# Patient Record
Sex: Female | Born: 1990
Health system: Southern US, Community
[De-identification: ages and names within clinical notes are randomized; demographics above are authoritative.]

## PROBLEM LIST (undated history)

## (undated) DIAGNOSIS — G932 Benign intracranial hypertension: Secondary | ICD-10-CM

## (undated) DIAGNOSIS — E119 Type 2 diabetes mellitus without complications: Secondary | ICD-10-CM

## (undated) DIAGNOSIS — I10 Essential (primary) hypertension: Secondary | ICD-10-CM

## (undated) HISTORY — DX: Essential (primary) hypertension: I10

## (undated) HISTORY — PX: GASTRIC BYPASS: SHX52

## (undated) HISTORY — DX: Morbid (severe) obesity due to excess calories: E66.01

## (undated) HISTORY — DX: Benign intracranial hypertension: G93.2

---
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
## 2012-07-07 ENCOUNTER — Emergency Department (INDEPENDENT_AMBULATORY_CARE_PROVIDER_SITE_OTHER)
Admission: EM | Admit: 2012-07-07 | Discharge: 2012-07-07 | Disposition: A | Discharge status: Home / Self Care | Payer: Medicaid Other | Source: Home / Self Care | Attending: Emergency Medicine | Admitting: Emergency Medicine

## 2012-07-07 ENCOUNTER — Encounter (HOSPITAL_COMMUNITY): Payer: Self-pay

## 2012-07-07 ENCOUNTER — Ambulatory Visit (HOSPITAL_COMMUNITY)
Admission: RE | Admit: 2012-07-07 | Discharge: 2012-07-07 | Disposition: A | Discharge status: Home / Self Care | Payer: Medicaid Other | Source: Ambulatory Visit | Attending: Family Medicine | Admitting: Family Medicine

## 2012-07-07 DIAGNOSIS — R51 Headache: Secondary | ICD-10-CM | POA: Insufficient documentation

## 2012-07-07 DIAGNOSIS — G44209 Tension-type headache, unspecified, not intractable: Secondary | ICD-10-CM

## 2012-07-07 DIAGNOSIS — R42 Dizziness and giddiness: Secondary | ICD-10-CM | POA: Insufficient documentation

## 2012-07-07 DIAGNOSIS — H538 Other visual disturbances: Secondary | ICD-10-CM | POA: Insufficient documentation

## 2012-07-07 DIAGNOSIS — H539 Unspecified visual disturbance: Secondary | ICD-10-CM

## 2012-07-07 DIAGNOSIS — G43909 Migraine, unspecified, not intractable, without status migrainosus: Secondary | ICD-10-CM

## 2012-07-07 DIAGNOSIS — J329 Chronic sinusitis, unspecified: Secondary | ICD-10-CM

## 2012-07-07 LAB — CBC WITH DIFFERENTIAL/PLATELET
Basophils Absolute: 0 10*3/uL (ref 0.0–0.1)
Eosinophils Relative: 2 % (ref 0–5)
HCT: 37.2 % (ref 36.0–46.0)
Hemoglobin: 13.2 g/dL (ref 12.0–15.0)
Lymphocytes Relative: 30 % (ref 12–46)
Lymphs Abs: 3.9 10*3/uL (ref 0.7–4.0)
MCH: 29.5 pg (ref 26.0–34.0)
MCV: 83.2 fL (ref 78.0–100.0)
Monocytes Absolute: 0.6 10*3/uL (ref 0.1–1.0)
Neutro Abs: 8.4 10*3/uL — ABNORMAL HIGH (ref 1.7–7.7)
RBC: 4.47 MIL/uL (ref 3.87–5.11)
RDW: 13 % (ref 11.5–15.5)
WBC: 13.2 10*3/uL — ABNORMAL HIGH (ref 4.0–10.5)

## 2012-07-07 LAB — COMPREHENSIVE METABOLIC PANEL
ALT: 19 U/L (ref 0–35)
AST: 16 U/L (ref 0–37)
Albumin: 4.4 g/dL (ref 3.5–5.2)
CO2: 25 mEq/L (ref 19–32)
Calcium: 10.1 mg/dL (ref 8.4–10.5)
Chloride: 99 mEq/L (ref 96–112)
Creatinine, Ser: 0.79 mg/dL (ref 0.50–1.10)
GFR calc Af Amer: >90 mL/min (ref >90)
GFR calc non Af Amer: >90 mL/min (ref >90)
Glucose, Bld: 88 mg/dL (ref 70–99)
Total Bilirubin: 0.2 mg/dL — ABNORMAL LOW (ref 0.3–1.2)

## 2012-07-07 LAB — SEDIMENTATION RATE: Sed Rate: 21 mm/hr (ref 0–22)

## 2012-07-07 LAB — POCT URINALYSIS DIP (DEVICE)
Glucose, UA: NEGATIVE mg/dL
Leukocytes, UA: NEGATIVE
Protein, ur: 100 mg/dL — AB
Specific Gravity, Urine: 1.02 (ref 1.005–1.030)
Urobilinogen, UA: 0.2 mg/dL (ref 0.0–1.0)

## 2012-07-07 LAB — LIPID PANEL
HDL: 45 mg/dL (ref >39)
LDL Cholesterol: 94 mg/dL (ref 0–99)
Triglycerides: 99 mg/dL (ref <150)

## 2012-07-07 LAB — HEMOGLOBIN A1C
Hgb A1c MFr Bld: 5.6 % (ref <5.7)
Mean Plasma Glucose: 114 mg/dL (ref <117)

## 2012-07-07 LAB — GLUCOSE, CAPILLARY: Glucose-Capillary: 81 mg/dL (ref 70–99)

## 2012-07-07 MED ORDER — NAPROXEN 500 MG PO TABS: 500.0000 mg | ORAL_TABLET | Freq: Two times a day (BID) | ORAL | Status: DC

## 2012-07-07 MED ORDER — FLUTICASONE PROPIONATE 50 MCG/ACT NA SUSP: 2.0000 | Freq: Every day | NASAL | Status: DC

## 2012-07-07 MED ORDER — ACIDOPHILUS PROBIOTIC 100 MG PO CAPS: 1.0000 | ORAL_CAPSULE | Freq: Three times a day (TID) | ORAL | Status: DC

## 2012-07-07 MED ORDER — LORATADINE 10 MG PO TABS: 10.0000 mg | ORAL_TABLET | Freq: Every day | ORAL | Status: DC

## 2012-07-07 MED ORDER — AMOXICILLIN 500 MG PO CAPS: 500.0000 mg | ORAL_CAPSULE | Freq: Three times a day (TID) | ORAL | Status: DC

## 2012-07-07 NOTE — ED Notes (Signed)
Patients states has had a headache for almost 2 weeks, some nausea pain , pain in back of neck, blurred vision

## 2012-07-07 NOTE — ED Provider Notes (Addendum)
History    CSN: [id number]  Arrival date & time 07/07/12  1519   First MD Initiated Contact with Patient 07/07/12 1527     Chief Complaint  Patient presents with  . Headache   Patient is a 22 y.o. female presenting with headaches. The history is provided by the patient. The history is limited by a language barrier. A language interpreter was used.  Headache The primary symptoms include headaches. The symptoms began more than 1 week ago. The symptoms are improving.  The headache is not associated with neck stiffness.  Additional symptoms do not include neck stiffness.   Pt reporting that she his having a headache for the past almost 2 weeks.  Pt says that it started with a right sided mild headache and then it got stronger over the next days.  It has been associated with nausea and some vomiting.  Pt says that it has been a 10/10 headache at its worse.   Pt says it is an 8/10 now.  Pt says that she started having blurry vision yesterday.   Pt says that she is having blurry vision.  No frequent urination reported.  Pt says that she does not have a history of frequent headaches.   Pt says that she was taking midol and took some advil cold and sinus.  Pt says that it helped some.  Pt says that she is not having pressure around the sinus or runny nose and no postnasal drainage.   Pt says she is not having any abdominal pain.  No fever.  Pt says that she has had some fever.  No weakness in extremities.   Pt says that she did have her period last month.  Pt says not sexually active.   No coughing or sneezing.  Pt says that she is having dry throat sometimes.    History reviewed. No pertinent past medical history.  History reviewed. No pertinent past surgical history.  No family history on file.  History  Substance Use Topics  . Smoking status: Never Smoker   . Smokeless tobacco: Not on file  . Alcohol Use: No    OB History    Grav Para Term Preterm Abortions TAB SAB Ect Mult Living          Review of Systems  Constitutional: Positive for appetite change and fatigue.  HENT: Positive for neck pain. Negative for neck stiffness.   Eyes: Positive for visual disturbance.  Neurological: Positive for headaches.  Hematological: Negative.   Psychiatric/Behavioral: Negative.   All other systems reviewed and are negative.    Allergies  Review of patient's allergies indicates no known allergies.  Home Medications  No current outpatient prescriptions on file.  BP 132/75  Pulse 87  Temp 98.5 F (36.9 C) (Oral)  Resp 18  SpO2 100%  LMP 05/24/2012  Physical Exam  Nursing note and vitals reviewed. Constitutional: She is oriented to person, place, and time. She appears well-developed and well-nourished.       Obese female   HENT:  Head: Normocephalic and atraumatic.  Nose: Mucosal edema present. Right sinus exhibits maxillary sinus tenderness. Left sinus exhibits maxillary sinus tenderness.  Mouth/Throat: No oropharyngeal exudate.       Yellow crusting seen in nasal cavities  Eyes: Conjunctivae normal and EOM are normal. Pupils are equal, round, and reactive to light. Right eye exhibits no discharge. Left eye exhibits no discharge. No scleral icterus.       Could not perform adequate fundoscopic exam  Neck: Normal range of motion. Neck supple.    Cardiovascular: Normal rate, regular rhythm and normal heart sounds.   Pulmonary/Chest: Effort normal and breath sounds normal.  Abdominal: Soft. Bowel sounds are normal.  Musculoskeletal: Normal range of motion. She exhibits tenderness.       Tenderness in trapezius muscle right side of neck and shoulder  FROM of neck no deformities noted   Neurological: She is alert and oriented to person, place, and time. She has normal strength. No cranial nerve deficit or sensory deficit. She exhibits normal muscle tone. She displays a negative Romberg sign. Coordination and gait normal.  Skin: Skin is warm and dry.  Psychiatric:  She has a normal mood and affect. Her behavior is normal. Judgment and thought content normal.    ED Course  Procedures (including critical care time)  Labs Reviewed - No data to display No results found.   No diagnosis found.   MDM  IMPRESSION  Headache  ? Migraine headache  Sinusitis  Allergic Rhinitis  Obesity  Blurry Visual changes  RECOMMENDATIONS / PLAN Sending patient for STAT CT of the head without contrast.  Sinusitis will be treated with amoxicillin 500 mg by mouth 3 times a day, Flonase nasal spray and loratadine ordered.  In addition, I will order some labs today including a CBC diff, metabolic panel, blood sugar, urinalysis and we'll followup on those results.  The patient was given instructions to go to the ER if symptoms do not improve or worsen.  She verbalized understanding. Also considered pseudotumor cerebri (IIH), after CT scan, if no mass lesions seen then likely will send for LP and refer to opthalmology and neurology for further eval.  Unable to get adequate fundoscopic exam in office not being able to dilate eyes fully, did not appreciate any papilledema on limited exam.   FOLLOW UP 1 week for recheck  The patient was given clear instructions to go to ER or return to medical center if symptoms don't improve, worsen or new problems develop.  The patient verbalized understanding.  The patient was told to call to get lab results if they haven't heard anything in the next week.            Cleora Fleet, MD 07/07/12 1956  Cleora Fleet, MD 07/08/12 [phone]

## 2012-07-08 ENCOUNTER — Encounter (HOSPITAL_COMMUNITY): Payer: Self-pay

## 2012-07-08 ENCOUNTER — Emergency Department (HOSPITAL_COMMUNITY)
Admission: EM | Admit: 2012-07-08 | Discharge: 2012-07-08 | Disposition: A | Discharge status: Home / Self Care | Payer: Medicaid Other | Attending: Emergency Medicine | Admitting: Emergency Medicine

## 2012-07-08 ENCOUNTER — Telehealth (HOSPITAL_COMMUNITY): Payer: Self-pay

## 2012-07-08 DIAGNOSIS — G932 Benign intracranial hypertension: Secondary | ICD-10-CM | POA: Insufficient documentation

## 2012-07-08 LAB — CBC WITH DIFFERENTIAL/PLATELET
Basophils Absolute: 0 10*3/uL (ref 0.0–0.1)
Basophils Relative: 0 % (ref 0–1)
Lymphocytes Relative: 36 % (ref 12–46)
MCHC: 36.5 g/dL — ABNORMAL HIGH (ref 30.0–36.0)
Neutro Abs: 6.4 10*3/uL (ref 1.7–7.7)
Neutrophils Relative %: 55 % (ref 43–77)
Platelets: 326 10*3/uL (ref 150–400)
RDW: 12.7 % (ref 11.5–15.5)
WBC: 11.7 10*3/uL — ABNORMAL HIGH (ref 4.0–10.5)

## 2012-07-08 LAB — BASIC METABOLIC PANEL
CO2: 26 mEq/L (ref 19–32)
Chloride: 102 mEq/L (ref 96–112)
Creatinine, Ser: 0.78 mg/dL (ref 0.50–1.10)
GFR calc Af Amer: >90 mL/min (ref >90)
Potassium: 3.9 mEq/L (ref 3.5–5.1)
Sodium: 139 mEq/L (ref 135–145)

## 2012-07-08 MED ORDER — ACETAZOLAMIDE 125 MG PO TABS: 250.0000 mg | ORAL_TABLET | Freq: Three times a day (TID) | ORAL | Status: DC

## 2012-07-08 MED ORDER — SODIUM CHLORIDE 0.9 % IV BOLUS (SEPSIS): 1000.0000 mL | Freq: Once | INTRAVENOUS | Status: DC

## 2012-07-08 NOTE — Consult Note (Signed)
NEURO HOSPITALIST CONSULT NOTE    Reason for Consult: Headache and blurred vision with papilledema.  HPI:                                                                                                                                          Gabriela Cantu is an 22 y.o. female is seen in the emergency room last 2 days with complaint of headache and blurred vision. She was seen at urgent care Center noted papilledema and referred her to ophthalmology who confirmed that she does have bilateral papilledema. CT scan of her head was done yesterday which was unremarkable. Patient was referred from ophthalmology to the emergency room here for lumbar puncture and neurology evaluation. She is not complaining of nausea and has not had diplopia. Patient is markedly obese and is strongly suspected to have having pseudotumor cerebra.  History reviewed. No pertinent past medical history.  History reviewed. No pertinent past surgical history.  Family history is noncontributory.  Social History:  reports that she has never smoked. She does not have any smokeless tobacco history on file. She reports that she does not drink alcohol. Her drug history not on file.  No Known Allergies  MEDICATIONS:                                                                                                                     Prior to Admission:  Amoxicillin 500 mg 3 times a day Flonase nasal spray 2 sprays in each nostril daily Lactobacillus 1 capsule 3 times a day Claritin 10 mg per day Naprosyn 500 mg twice a day.    Blood pressure 146/93, pulse 86, temperature 98.1 F (36.7 C), temperature source Oral, resp. rate 18, last menstrual period 05/24/2012, SpO2 100.00%.   Neurologic Examination:  Mental Status: Alert, oriented, thought content appropriate.  Speech fluent without evidence of aphasia.  Able to follow commands without difficulty. Cranial Nerves: II-Visual fields were normal. Patient was able to read 12 point font print accurately with each eye independently. III/IV/VI-Pupils were dilated and equal and react minimally to light. Extraocular movements were full and conjugate.    V/VII-no facial numbness and no facial weakness. VIII-normal. X-normal speech and symmetrical palatal movement. Motor: 5/5 bilaterally with normal tone and bulk Sensory: Normal throughout. Deep Tendon Reflexes: 2+ and symmetric. Cerebellar: Normal finger-to-nose testing.  Lab Results  Component Value Date/Time   CHOL 159 07/07/2012  6:00 PM    Results for orders placed during the hospital encounter of 07/08/12 (from the past 48 hour(s))  CBC WITH DIFFERENTIAL     Status: Abnormal   Collection Time   07/08/12  6:53 PM      Component Value Range Comment   WBC 11.7 (*) 4.0 - 10.5 K/uL    RBC 4.37  3.87 - 5.11 MIL/uL    Hemoglobin 13.2  12.0 - 15.0 g/dL    HCT 16.1  09.6 - 04.5 %    MCV 82.8  78.0 - 100.0 fL    MCH 30.2  26.0 - 34.0 pg    MCHC 36.5 (*) 30.0 - 36.0 g/dL    RDW 40.9  81.1 - 91.4 %    Platelets 326  150 - 400 K/uL    Neutrophils Relative 55  43 - 77 %    Neutro Abs 6.4  1.7 - 7.7 K/uL    Lymphocytes Relative 36  12 - 46 %    Lymphs Abs 4.2 (*) 0.7 - 4.0 K/uL    Monocytes Relative 6  3 - 12 %    Monocytes Absolute 0.7  0.1 - 1.0 K/uL    Eosinophils Relative 3  0 - 5 %    Eosinophils Absolute 0.3  0.0 - 0.7 K/uL    Basophils Relative 0  0 - 1 %    Basophils Absolute 0.0  0.0 - 0.1 K/uL   BASIC METABOLIC PANEL     Status: Normal   Collection Time   07/08/12  6:53 PM      Component Value Range Comment   Sodium 139  135 - 145 mEq/L    Potassium 3.9  3.5 - 5.1 mEq/L    Chloride 102  96 - 112 mEq/L    CO2 26  19 - 32 mEq/L    Glucose, Bld 90  70 - 99 mg/dL    BUN 12  6 - 23 mg/dL    Creatinine, Ser 7.82  0.50 - 1.10 mg/dL    Calcium 9.7  8.4 - 95.6 mg/dL    GFR calc non Af  Amer >90  >90 mL/min    GFR calc Af Amer >90  >90 mL/min     Ct Head Wo Contrast  07/07/2012  *RADIOLOGY REPORT*  Clinical Data: 22 year old female with severe headache, blurred vision and dizziness.  CT HEAD WITHOUT CONTRAST  Technique:  Contiguous axial images were obtained from the base of the skull through the vertex without contrast.  Comparison: None  Findings: No intracranial abnormalities are identified, including mass lesion or mass effect, hydrocephalus, extra-axial fluid collection, midline shift, hemorrhage, or acute infarction.  The visualized bony calvarium is unremarkable.  IMPRESSION: Unremarkable noncontrast head CT.   Original Report Authenticated By: Harmon Pier, M.D.    Assessment/Plan: Likely new onset pseudotumor cerebri as etiology for  headaches and papilledema as well as blurring of vision. CT scan of her head showed no indications of acute intracranial abnormality.  Recommendations: 1. Diamox 250 mg by mouth every 8 hours 2. Lumbar puncture under fluoroscopy as planned. 3. Keep scheduled appointment with [facility] for followup 4. Return to emergency room for evaluation if headache acutely worsens or visual abnormalities become worse.  [facility] [phone]  07/08/2012, 8:55 PM

## 2012-07-08 NOTE — Progress Notes (Signed)
I spoke with the ophthalmologist and saw the patient in the office today for a complete dilated eye examination.  She told me that the patient has severe papilledema in both eyes.  She was very concerned about pseudotumor cerebri.  We are sending the patient to the emergency department so that she can be evaluated.  She's going to need a lumbar puncture and she's going to need to be evaluated by a neurologist and started on medications.   I'm going to call the emergency department and let him know that we are sending this patient over from the ophthalmology office for treatment.   Rodney Langton, MD, CDE, FAAFP [facility] [place], [place]

## 2012-07-08 NOTE — ED Notes (Signed)
Neurology at bedside.

## 2012-07-08 NOTE — ED Notes (Signed)
Pt. Has had a headache for 2 weeks .  She went to urgent care yesterday and they sent her to eye doctor Gabriela Cantu,  She diagnosed her with optic nerve edema.   Also has bilateral disc edema .  Dr. Laural Benes at the Urgent care and Dr. Charlotte Sanes, [phone]. Recommend the pt. Come to the ED for neurology eval and also to r/o menigitis,  Pt. Reports having blurred vision

## 2012-07-08 NOTE — Telephone Encounter (Signed)
Message copied by Lestine Mount on Wed Jul 08, 2012 10:08 AM ------      Message from: Cleora Fleet      Created: Tue Jul 07, 2012  7:57 PM      Regarding: Please call results        Please call and notify the patient that her CT scan came back negative for any acute abnormalities.  Her blood work also came back but he revealed that she had an elevated white blood cell count which is consistent with a possible infection.  I would like for her to return to the clinic in 2 days to have a repeat CBC drawn with the nurse.  I would like for her to continue to take her medications and if her symptoms got any worse to go to the emergency department for further care.                  Rodney Langton, MD, CDE, FAAFP      [facility]      [facility]      [place], [place]

## 2012-07-08 NOTE — Progress Notes (Signed)
I spoke with the office for the ophthalmologist on call Dr. Randon Goldsmith and asked if they would see the patient today to perform complete dilated eye exam to evaluate for papilledema.   I also spoke with a neurologist Dr. Janeece Riggers with [facility] [facility] and ran the case by him.  He said that we should pursue lumbar puncture if the patient has evidence of papilledema.  He said it was low likelihood that the patient would have pseudotumor cerebri if there was no papilledema.  Pt can be seen by opthalmologist today at 1:30 pm for eye exam.   Will fax notes and records to Dr. Randon Goldsmith office.     Rodney Langton, MD, CDE, FAAFP [facility] [place], [place]

## 2012-07-08 NOTE — Progress Notes (Signed)
Quick Note:  Patient was notified of the results by telephone. She's currently being evaluated in the hospital for papilledema and possible pseudotumor cerebri.   Rodney Langton, MD, CDE, FAAFP [facility] [place], [place]   ______

## 2012-07-08 NOTE — Telephone Encounter (Signed)
Message copied by Lestine Mount on Wed Jul 08, 2012 10:17 AM ------      Message from: Cleora Fleet      Created: Tue Jul 07, 2012  7:57 PM      Regarding: Please call results        Please call and notify the patient that her CT scan came back negative for any acute abnormalities.  Her blood work also came back but he revealed that she had an elevated white blood cell count which is consistent with a possible infection.  I would like for her to return to the clinic in 2 days to have a repeat CBC drawn with the nurse.  I would like for her to continue to take her medications and if her symptoms got any worse to go to the emergency department for further care.                  Rodney Langton, MD, CDE, FAAFP      [facility]      [facility]      [place], [place]

## 2012-07-09 NOTE — ED Provider Notes (Signed)
History     CSN: [id number]  Arrival date & time 07/08/12  1727   First MD Initiated Contact with Patient 07/08/12 1819      Chief Complaint  Patient presents with  . Headache    (Consider location/radiation/quality/duration/timing/severity/associated sxs/prior treatment) HPI Comments: Pt comes in with cc of headaches. Pt was see nat an urgent care y'day for headaches with blurry vision. She was sent to [facility] clinic today, noted to have bilateral disk edema and nerve edema, so sent to the ED for further evaluation and possible Neuro evaluation. Pt reports having the headache for few days now. No hx of headaches. She has associated blurry vision. She denies any  nausea, vomiting, seizures, altered mental status, loss of consciousness, new weakness, or numbness, no gait instability. Pt has no fevers, chills. No family hx of brain aneurysms, Multiple sclerosis. Her headache is actually better today. It is diffusely located, and is not worse with laying supine.  Patient is a 22 y.o. female presenting with headaches. The history is provided by the patient and medical records.  Headache  Pertinent negatives include no shortness of breath, no nausea and no vomiting.    History reviewed. No pertinent past medical history.  History reviewed. No pertinent past surgical history.  No family history on file.  History  Substance Use Topics  . Smoking status: Never Smoker   . Smokeless tobacco: Not on file  . Alcohol Use: No    OB History    Grav Para Term Preterm Abortions TAB SAB Ect Mult Living                  Review of Systems  Constitutional: Negative for activity change.  HENT: Negative for facial swelling and neck pain.   Respiratory: Negative for cough, shortness of breath and wheezing.   Cardiovascular: Negative for chest pain.  Gastrointestinal: Negative for nausea, vomiting, abdominal pain, diarrhea, constipation, blood in stool and abdominal distention.    Genitourinary: Negative for hematuria and difficulty urinating.  Skin: Negative for color change.  Neurological: Positive for headaches. Negative for dizziness, syncope, speech difficulty and weakness.  Hematological: Does not bruise/bleed easily.  Psychiatric/Behavioral: Negative for confusion.    Allergies  Review of patient's allergies indicates no known allergies.  Home Medications   Current Outpatient Rx  Name  Route  Sig  Dispense  Refill  . AMOXICILLIN 500 MG PO CAPS   Oral   Take 1 capsule (500 mg total) by mouth 3 (three) times daily.   30 capsule   0   . FLUTICASONE PROPIONATE 50 MCG/ACT NA SUSP   Nasal   Place 2 sprays into the nose daily.   16 g   2   . ACIDOPHILUS PROBIOTIC 100 MG PO CAPS   Oral   Take 1 capsule (100 mg total) by mouth 3 (three) times daily with meals.   90 capsule   0   . LORATADINE 10 MG PO TABS   Oral   Take 1 tablet (10 mg total) by mouth daily.   30 tablet   0   . NAPROXEN 500 MG PO TABS   Oral   Take 1 tablet (500 mg total) by mouth 2 (two) times daily with a meal.   20 tablet   0   . ACETAZOLAMIDE 125 MG PO TABS   Oral   Take 2 tablets (250 mg total) by mouth 3 (three) times daily.   21 tablet   0  BP 146/93  Pulse 86  Temp 98.1 F (36.7 C) (Oral)  Resp 18  SpO2 100%  LMP 05/24/2012  Physical Exam  Nursing note and vitals reviewed. Constitutional: She is oriented to person, place, and time. She appears well-developed.  HENT:  Head: Normocephalic and atraumatic.  Eyes: Conjunctivae normal and EOM are normal.       Dilated pupils - post optho evaluation  Neck: Normal range of motion. Neck supple.  Cardiovascular: Normal rate, regular rhythm and normal heart sounds.   Pulmonary/Chest: Effort normal and breath sounds normal. No respiratory distress.  Abdominal: Soft. Bowel sounds are normal. She exhibits no distension. There is no tenderness. There is no rebound and no guarding.  Neurological: She is alert  and oriented to person, place, and time. No cranial nerve deficit. Coordination normal.  Skin: Skin is warm and dry.    ED Course  Procedures (including critical care time)  Labs Reviewed  CBC WITH DIFFERENTIAL - Abnormal; Notable for the following:    WBC 11.7 (*)     MCHC 36.5 (*)     Lymphs Abs 4.2 (*)     All other components within normal limits  BASIC METABOLIC PANEL  LAB REPORT - SCANNED   No results found.   1. Pseudotumor cerebri       MDM  Pt comes in with cc of headaches. My concerns are that patient has pseudotumor cerebri. She is young, obese, new headache, severe, with optho exam revealing signs of increased ICP  I spoke with Dr. Roseanne Reno, Neurology, who wants a LP for diagnostic and possibly therapeutic reasons. I called IR, and LP will be done as an outpatient tomorrow. The reason being, i checked patient's landmarks, and given her obesity, she doesnst have the best land marks, especially in lateral decubitus position.  Neurology did see the patient - and Dr. Roseanne Reno wants [place] to start her on specific tx - which will be done.  Pt explained that she will get a call from IR tomorrow -if she hasnt received a call by noon, to call the ED for facilitation, or just come to the ER.  Derwood Kaplan, MD 07/09/12 2207

## 2012-07-10 ENCOUNTER — Emergency Department (HOSPITAL_COMMUNITY)
Admission: EM | Admit: 2012-07-10 | Discharge: 2012-07-10 | Disposition: A | Discharge status: Home / Self Care | Payer: Self-pay | Source: Home / Self Care

## 2012-07-10 ENCOUNTER — Emergency Department (HOSPITAL_COMMUNITY)
Admission: EM | Admit: 2012-07-10 | Discharge: 2012-07-10 | Disposition: A | Discharge status: Home / Self Care | Payer: Medicaid Other | Attending: Emergency Medicine | Admitting: Emergency Medicine

## 2012-07-10 ENCOUNTER — Encounter (HOSPITAL_COMMUNITY): Payer: Self-pay | Admitting: *Deleted

## 2012-07-10 DIAGNOSIS — G932 Benign intracranial hypertension: Secondary | ICD-10-CM | POA: Insufficient documentation

## 2012-07-10 DIAGNOSIS — IMO0002 Reserved for concepts with insufficient information to code with codable children: Secondary | ICD-10-CM | POA: Insufficient documentation

## 2012-07-10 MED ORDER — HYDROCODONE-ACETAMINOPHEN 5-325 MG PO TABS: 1.0000 | ORAL_TABLET | Freq: Four times a day (QID) | ORAL | Status: DC | PRN

## 2012-07-10 MED ORDER — LORAZEPAM 0.5 MG PO TABS: 0.5000 mg | ORAL_TABLET | Freq: Three times a day (TID) | ORAL | Status: DC | PRN

## 2012-07-10 NOTE — ED Provider Notes (Signed)
History     CSN: [id number]  Arrival date & time 07/10/12  1646   First MD Initiated Contact with Patient 07/10/12 1704      Chief Complaint  Patient presents with  . Headache    (Consider location/radiation/quality/duration/timing/severity/associated sxs/prior treatment) HPI Patient presents to the emergency department with continued headache.  Patient states she did not go to the appointment for her therapeutic fluoroscopy guided LP.  Patient states that her vision has gotten a little, but worse in the pressure in her head is a little worse, but no other significant findings.  Patient denies vomiting, diarrhea, chest dentures breath, neck pain, or fever. History reviewed. No pertinent past medical history.  History reviewed. No pertinent past surgical history.  No family history on file.  History  Substance Use Topics  . Smoking status: Never Smoker   . Smokeless tobacco: Not on file  . Alcohol Use: No    OB History    Grav Para Term Preterm Abortions TAB SAB Ect Mult Living                  Review of Systems All other systems negative except as documented in the HPI. All pertinent positives and negatives as reviewed in the HPI. Allergies  Review of patient's allergies indicates no known allergies.  Home Medications   Current Outpatient Rx  Name  Route  Sig  Dispense  Refill  . ACETAZOLAMIDE 125 MG PO TABS   Oral   Take 2 tablets (250 mg total) by mouth 3 (three) times daily.   21 tablet   0   . FLUTICASONE PROPIONATE 50 MCG/ACT NA SUSP   Nasal   Place 2 sprays into the nose daily.   16 g   2   . LORATADINE 10 MG PO TABS   Oral   Take 1 tablet (10 mg total) by mouth daily.   30 tablet   0   . NAPROXEN 500 MG PO TABS   Oral   Take 1 tablet (500 mg total) by mouth 2 (two) times daily with a meal.   20 tablet   0     BP 132/82  Pulse 81  Temp 97.5 F (36.4 C) (Oral)  Resp 20  SpO2 98%  LMP 05/24/2012  Physical Exam  Nursing note and  vitals reviewed. Constitutional: She is oriented to person, place, and time. She appears well-developed and well-nourished.  HENT:  Head: Normocephalic and atraumatic.  Eyes: EOM are normal. Pupils are equal, round, and reactive to light.  Neck: Normal range of motion. Neck supple.  Cardiovascular: Normal rate, regular rhythm and normal heart sounds.  Exam reveals no gallop and no friction rub.   No murmur heard. Pulmonary/Chest: Effort normal and breath sounds normal. No respiratory distress.  Neurological: She is alert and oriented to person, place, and time. She exhibits normal muscle tone. Coordination normal.  Skin: Skin is warm and dry. No rash noted. She is not diaphoretic. No erythema.    ED Course  Procedures (including critical care time)  I spoke with the interventional radiologist and felt like that he would be better to do the LP in the morning.  Patient is given explicit instructions on how to come back for this procedure.  Patient is advised that if she cannot figure it out she can come back to the emergency   MDM          Carlyle Dolly, PA-C 07/10/12 1843

## 2012-07-10 NOTE — ED Provider Notes (Signed)
Medical screening examination/treatment/procedure(s) were performed by non-physician practitioner and as supervising physician I was immediately available for consultation/collaboration.   Lyanne Co, MD 07/10/12 [phone]

## 2012-07-10 NOTE — ED Notes (Signed)
Headaches with blurred vision for 2 weeks  With n v no diarrhea.  No past history

## 2012-07-11 ENCOUNTER — Emergency Department (HOSPITAL_COMMUNITY): Payer: Medicaid Other

## 2012-07-11 ENCOUNTER — Emergency Department (HOSPITAL_COMMUNITY)
Admission: EM | Admit: 2012-07-11 | Discharge: 2012-07-11 | Disposition: A | Discharge status: Home / Self Care | Payer: Medicaid Other | Attending: Emergency Medicine | Admitting: Emergency Medicine

## 2012-07-11 ENCOUNTER — Encounter (HOSPITAL_COMMUNITY): Payer: Self-pay | Admitting: Emergency Medicine

## 2012-07-11 DIAGNOSIS — G932 Benign intracranial hypertension: Secondary | ICD-10-CM | POA: Insufficient documentation

## 2012-07-11 DIAGNOSIS — Z79899 Other long term (current) drug therapy: Secondary | ICD-10-CM | POA: Insufficient documentation

## 2012-07-11 DIAGNOSIS — R51 Headache: Secondary | ICD-10-CM

## 2012-07-11 DIAGNOSIS — Z3202 Encounter for pregnancy test, result negative: Secondary | ICD-10-CM | POA: Insufficient documentation

## 2012-07-11 DIAGNOSIS — H547 Unspecified visual loss: Secondary | ICD-10-CM | POA: Insufficient documentation

## 2012-07-11 LAB — CSF CELL COUNT WITH DIFFERENTIAL
RBC Count, CSF: 4 /mm3 — ABNORMAL HIGH
Tube #: 3
WBC, CSF: 3 /mm3 (ref 0–5)

## 2012-07-11 LAB — PROTEIN, CSF: Total  Protein, CSF: 26 mg/dL (ref 15–45)

## 2012-07-11 LAB — GRAM STAIN

## 2012-07-11 LAB — POCT PREGNANCY, URINE: Preg Test, Ur: NEGATIVE

## 2012-07-11 LAB — GLUCOSE, CSF: Glucose, CSF: 53 mg/dL (ref 43–76)

## 2012-07-11 MED ORDER — DIAZEPAM 5 MG PO TABS
5.0000 mg | ORAL_TABLET | Freq: Once | ORAL | Status: AC
  Administered 2012-07-11: 5 mg via ORAL
  Filled 2012-07-11: qty 1

## 2012-07-11 NOTE — ED Notes (Signed)
I gave the patient a happy meal and a cup of ice water. 

## 2012-07-11 NOTE — ED Notes (Signed)
Patient transported to CT 

## 2012-07-11 NOTE — ED Provider Notes (Signed)
History    22 year old female with headache. Patient has been evaluated multiple times recently by family practice and in emergency room. Neurology was consulted as well. Presumed pseudotumor cerebri. Patient has the right demographic. She's complaining of headaches. Visual loss. Bilateral papilledema noted by ophthalmology. She started on acetazolamide which she reports compliance with. She was referred to interventional radiology for lumbar puncture to confirm diagnosis. It appears there has been some miscommunication and patient was referred back to the emergency room.  CSN: [id number]  Arrival date & time 07/11/12  1120   First MD Initiated Contact with Patient 07/11/12 1132      Chief Complaint  Patient presents with  . Headache    (Consider location/radiation/quality/duration/timing/severity/associated sxs/prior treatment) HPI  History reviewed. No pertinent past medical history.  History reviewed. No pertinent past surgical history.  No family history on file.  History  Substance Use Topics  . Smoking status: Never Smoker   . Smokeless tobacco: Not on file  . Alcohol Use: No    OB History    Grav Para Term Preterm Abortions TAB SAB Ect Mult Living                  Review of Systems  All systems reviewed and negative, other than as noted in HPI.   Allergies  Review of patient's allergies indicates no known allergies.  Home Medications   Current Outpatient Rx  Name  Route  Sig  Dispense  Refill  . ACETAZOLAMIDE 125 MG PO TABS   Oral   Take 2 tablets (250 mg total) by mouth 3 (three) times daily.   21 tablet   0   . HYDROCODONE-ACETAMINOPHEN 5-325 MG PO TABS   Oral   Take 1 tablet by mouth every 6 (six) hours as needed. For pain.         Marland Kitchen LORATADINE 10 MG PO TABS   Oral   Take 1 tablet (10 mg total) by mouth daily.   30 tablet   0   . LORAZEPAM 0.5 MG PO TABS   Oral   Take 0.5 mg by mouth 3 (three) times daily as needed. For anxiety.         Marland Kitchen NAPROXEN 500 MG PO TABS   Oral   Take 1 tablet (500 mg total) by mouth 2 (two) times daily with a meal.   20 tablet   0   . FLUTICASONE PROPIONATE 50 MCG/ACT NA SUSP   Nasal   Place 2 sprays into the nose daily.   16 g   2     BP 120/73  Pulse 88  Temp 99.5 F (37.5 C) (Oral)  Resp 18  SpO2 98%  LMP 05/24/2012  Physical Exam  Nursing note and vitals reviewed. Constitutional: She is oriented to person, place, and time.       Laying in bed. No acute distress. Obese.  HENT:  Head: Normocephalic and atraumatic.  Eyes: Conjunctivae normal are normal. Right eye exhibits no discharge. Left eye exhibits no discharge.  Neck: Neck supple.       No nuchal rigidity  Cardiovascular: Normal rate, regular rhythm and normal heart sounds.  Exam reveals no gallop and no friction rub.   No murmur heard. Pulmonary/Chest: Effort normal and breath sounds normal. No respiratory distress.  Abdominal: Soft. She exhibits no distension. There is no tenderness.  Musculoskeletal: She exhibits no edema and no tenderness.  Neurological: She is alert and oriented to person, place, and time. No cranial  nerve deficit. She exhibits normal muscle tone. Coordination normal.  Skin: Skin is warm and dry. She is not diaphoretic.  Psychiatric: She has a normal mood and affect. Her behavior is normal. Thought content normal.    ED Course  LUMBAR PUNCTURE Date/Time: 07/11/2012 1:00 PM Performed by: Raeford Razor Authorized by: Raeford Razor Consent: Verbal consent obtained. Written consent obtained. Risks and benefits: risks, benefits and alternatives were discussed Consent given by: patient Required items: required blood products, implants, devices, and special equipment available Patient identity confirmed: verbally with patient, arm band and provided demographic data Indications: evaluate for possible pseudotumor cereberi. Anesthesia: local infiltration Local anesthetic: lidocaine 1% without  epinephrine Anesthetic total: 2 ml Patient sedated: anxiolysis w/valium. Preparation: Patient was prepped and draped in the usual sterile fashion. Lumbar space: L3-L4 interspace Patient's position: left lateral decubitus Needle gauge: 22 Needle type: spinal needle - Quincke tip Number of attempts: 1 Post-procedure: site cleaned and adhesive bandage applied Patient tolerance: Patient tolerated the procedure well with no immediate complications. Comments: Unsuccessful LP   (including critical care time)  Labs Reviewed - No data to display No results found.   1. Headache       MDM  22yf with HA, visual loss, papilledema. Most likely pseudotumor cerebri. LP attempted unsuccessfully. Discussed with Dr Fredia Sorrow, IR. Requesting repeat head CT. If remains unchanged, willing to do under fluoro.    Pt received LP under flouro. No new complaints. Says currently HA resolved. Additional LP studied pending. Anticipate WNL. Safe for DC if so. Continue diamox and outpt neurology FU as previously arranged.      Raeford Razor, MD 07/11/12 [phone]

## 2012-07-11 NOTE — Procedures (Signed)
Procedure:  Fluoro guided lumbar puncture LP at L2/3 with 20 G spinal needle from right intralaminar approach.  Initial traumatic tap with clearing of CSF. Opening pressure approximately 40 cm of water. 14mL of CSF collected for labs.  Including amount wasted during procedure, approximately 18-20 mL of fluid removed. Closing pressure approximately 14 cm of water.

## 2012-07-11 NOTE — ED Notes (Signed)
Pt. Stated, I'm having bad back pain and I'm not seeing well out of my lt. Eye. Since Monday my lt eye vision is getting worse.

## 2012-07-11 NOTE — ED Notes (Signed)
Pt. Stated, she is suppose to have a lumbar puncture

## 2012-07-14 ENCOUNTER — Encounter: Payer: Self-pay | Admitting: Neurology

## 2012-07-14 ENCOUNTER — Ambulatory Visit (INDEPENDENT_AMBULATORY_CARE_PROVIDER_SITE_OTHER): Payer: Self-pay | Admitting: Neurology

## 2012-07-14 VITALS — BP 108/72 | HR 88 | Temp 98.1°F | Resp 20 | Wt 264.0 lb

## 2012-07-14 DIAGNOSIS — E669 Obesity, unspecified: Secondary | ICD-10-CM | POA: Insufficient documentation

## 2012-07-14 DIAGNOSIS — H5340 Unspecified visual field defects: Secondary | ICD-10-CM | POA: Insufficient documentation

## 2012-07-14 DIAGNOSIS — G932 Benign intracranial hypertension: Secondary | ICD-10-CM

## 2012-07-14 MED ORDER — ACETAZOLAMIDE 250 MG PO TABS: ORAL_TABLET | ORAL | Status: DC

## 2012-07-14 NOTE — Patient Instructions (Addendum)
1.  I need labs in 10 days.  Feb 7th 2.  Exercise and healthy diet 3.  Diamox - 250 mg - 2 tablets twice per day  [facility] [address] [place], [place] [address] [phone] 4.  Your MRI/MRV is scheduled for Thursday, January 30 at 12 noon.  Please arrive to [facility], first floor admitting by 11:45am.  [phone]. 5.  Please have your eye doctor fax your visual field testing to [place] at [phone]

## 2012-07-14 NOTE — Progress Notes (Signed)
Gabriela Cantu was seen today in neurologic consultation at the request of the ER.  The patients PCP is MCCUEN,CHRISTINE L, MD.  The consultation is for the evaluation of papilledema.  This patient is accompanied in the office by her aunt who supplements the history.  At their request, I also talked with a friend on the phone regarding diagnosis and treatment.  The patient was in the room with me during this phone call.  The patient is a 22 y.o. year old female with a history of headache and visual change. She states that headache began on Jan 4 but it got better initially with OTC medication.  However, over the next few days, she developed headache behind the eyes and in the b/l occiput.  She could hear the throbbing in the ears.  She had nausea and one episode of emesis and one nose bleed.  The patient first went to urgent care where papilledema was identified.  She was subsequently referred to the optometrist and severe papilledema was identified.  She was sent to the emergency room.  CT of the brain was performed that was negative.  She ultimately had a lumbar puncture under fluoroscopy.  Opening pressure was 400 mm of water.  She had relief of the headache following a lumbar puncture until yesterday, but it has been only mild yesterday and today.  She continues to have significant vision loss.  The L eye is worse than the R.  She was placed on Diamox.  The patient reports that she was only given 2 tablets of Diamox, 250 mg.  She is out of the medication.  She also mentions that she has no insurance.   She has not had MRI scanning.  She has had a negative CT of the brain.  PREVIOUS MEDICATIONS: Not applicable  ALLERGIES:  No Known Allergies  CURRENT MEDICATIONS:  Current Outpatient Prescriptions on File Prior to Visit  Medication Sig Dispense Refill  . HYDROcodone-acetaminophen (NORCO/VICODIN) 5-325 MG per tablet Take 1 tablet by mouth every 6 (six) hours as needed. For pain.      Marland Kitchen loratadine  (CLARITIN) 10 MG tablet Take 1 tablet (10 mg total) by mouth daily.  30 tablet  0  . LORazepam (ATIVAN) 0.5 MG tablet Take 0.5 mg by mouth 3 (three) times daily as needed. For anxiety.      . naproxen (NAPROSYN) 500 MG tablet Take 1 tablet (500 mg total) by mouth 2 (two) times daily with a meal.  20 tablet  0  . acetaZOLAMIDE (DIAMOX) 125 MG tablet Take 2 tablets (250 mg total) by mouth 3 (three) times daily.  21 tablet  0  . fluticasone (FLONASE) 50 MCG/ACT nasal spray Place 2 sprays into the nose daily.  16 g  2    PAST MEDICAL HISTORY:  History reviewed. No pertinent past medical history.  PAST SURGICAL HISTORY:   Past Surgical History  Procedure Date  . No surgical history Jan 2014    SOCIAL HISTORY:   History   Social History  . Marital Status: Single    Spouse Name: N/A    Number of Children: N/A  . Years of Education: N/A   Occupational History  . housekeeping    Social History Main Topics  . Smoking status: Never Smoker   . Smokeless tobacco: Never Used  . Alcohol Use: No  . Drug Use: Not on file  . Sexually Active: Not on file   Other Topics Concern  . Not on file  Social History Narrative  . No narrative on file    FAMILY HISTORY:   Family Status  Relation Status Death Age  . Mother Alive     congential renal disease  . Father Alive     ? medical health  . Sister Alive     3, alive and well    ROS:  A complete 10 system review of systems was obtained and was unremarkable apart from what is mentioned above.  PHYSICAL EXAMINATION:    VITALS:   Filed Vitals:   07/14/12 1334  BP: 108/72  Pulse: 88  Temp: 98.1 F (36.7 C)  Resp: 20  Weight: 264 lb (119.75 kg)    GEN:  Normal appears female in no acute distress.  Appears stated age. HEENT:  Normocephalic, atraumatic. The mucous membranes are moist. The superficial temporal arteries are without ropiness or tenderness. Cardiovascular: Regular rate and rhythm. Lungs: Clear to auscultation  bilaterally. Neck/Heme: There are no carotid bruits noted bilaterally.  NEUROLOGICAL: Orientation:  The patient is alert and oriented x 3.  Fund of knowledge is appropriate.  Recent and remote memory intact.  Attention span and concentration normal.  Repeats and names without difficulty. Cranial nerves: There is good facial symmetry. The pupils are equal round and reactive to light bilaterally. Fundoscopic exam reveals papilledema bilaterally. There is slight misalignment of the eyes in the neutral position, but extraocular muscles are intact without diplopia.  The visual fields are full when she covers the left eye.  When covering the right eye and viewing out of the left, she has complete left upper and lower quadrant VF loss.  Speech is fluent and clear. Soft palate rises symmetrically and there is no tongue deviation. Hearing is intact to conversational tone. Tone: Tone is good throughout. Sensation: Sensation is intact to light touch and pinprick throughout (facial, trunk, extremities). Vibration is intact at the bilateral big toe. There is no extinction with double simultaneous stimulation. There is no sensory dermatomal level identified. Coordination:  The patient has no difficulty with RAM's or FNF bilaterally. Motor: Strength is 5/5 in the bilateral upper and lower extremities.  Shoulder shrug is equal and symmetric. There is no pronator drift.  There are no fasciculations noted. DTR's: Deep tendon reflexes are 2/4 at the bilateral biceps, triceps, brachioradialis, patella and achilles.  Plantar responses are downgoing bilaterally. Gait and Station: The patient is able to ambulate without difficulty. The patient is able to heel toe walk without any difficulty. The patient is able to ambulate in a tandem fashion. The patient is able to stand in the Romberg position.  LABS:    Lab Results  Component Value Date   WBC 11.7* 07/08/2012   HGB 13.2 07/08/2012   HCT 36.2 07/08/2012   MCV 82.8  07/08/2012   PLT 326 07/08/2012     Chemistry      Component Value Date/Time   NA 139 07/08/2012 1853   K 3.9 07/08/2012 1853   CL 102 07/08/2012 1853   CO2 26 07/08/2012 1853   BUN 12 07/08/2012 1853   CREATININE 0.78 07/08/2012 1853      Component Value Date/Time   CALCIUM 9.7 07/08/2012 1853   ALKPHOS 76 07/07/2012 1800   AST 16 07/07/2012 1800   ALT 19 07/07/2012 1800   BILITOT 0.2* 07/07/2012 1800     Lab Results  Component Value Date   HGBA1C 5.6 07/07/2012   CSF analysis was unremarkable.  IMPRESSION/PLAN  1. pseudotumor cerebri.  -We discussed  the diagnosis as well as pathophysiology of the disease.  We discussed treatment options as well as prognostic indicators.  Patient education was provided.  -I. talked to the patient about the importance of weight loss and healthy diet and exercise.  -She will be restarted on Diamox, 500 mg twice a day.  Risks, benefits, side effects and alternative therapies were discussed.  The opportunity to ask questions was given and they were answered to the best of my ability.  The patient expressed understanding and willingness to follow the outlined treatment protocols.  -She is scheduled for her visual field testing tomorrow.  We will try to get a copy of that once completed.  -She will have an MRI/MRV of the brain.  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  However, I do not want her to drive unless cleared by her optometrist.  She states that she does not drive.  -The patient does have rather significant visual field loss.  She and I talked about the fact that this may be permanent.  We also talked about the importance of compliance with medication.  She expressed understanding.  -She will need a BMP in 1 week to 10 days.

## 2012-07-15 ENCOUNTER — Emergency Department (HOSPITAL_COMMUNITY): Payer: Medicaid Other

## 2012-07-15 ENCOUNTER — Emergency Department (HOSPITAL_COMMUNITY)
Admission: EM | Admit: 2012-07-15 | Discharge: 2012-07-16 | Disposition: A | Discharge status: Home / Self Care | Payer: Medicaid Other | Attending: Emergency Medicine | Admitting: Emergency Medicine

## 2012-07-15 ENCOUNTER — Other Ambulatory Visit: Payer: Self-pay

## 2012-07-15 ENCOUNTER — Telehealth: Payer: Self-pay

## 2012-07-15 ENCOUNTER — Encounter (HOSPITAL_COMMUNITY): Payer: Self-pay | Admitting: Emergency Medicine

## 2012-07-15 DIAGNOSIS — H539 Unspecified visual disturbance: Secondary | ICD-10-CM | POA: Insufficient documentation

## 2012-07-15 DIAGNOSIS — G932 Benign intracranial hypertension: Secondary | ICD-10-CM

## 2012-07-15 DIAGNOSIS — R112 Nausea with vomiting, unspecified: Secondary | ICD-10-CM | POA: Insufficient documentation

## 2012-07-15 DIAGNOSIS — Z79899 Other long term (current) drug therapy: Secondary | ICD-10-CM | POA: Insufficient documentation

## 2012-07-15 DIAGNOSIS — IMO0002 Reserved for concepts with insufficient information to code with codable children: Secondary | ICD-10-CM | POA: Insufficient documentation

## 2012-07-15 LAB — CSF CULTURE: Culture: NO GROWTH

## 2012-07-15 LAB — BASIC METABOLIC PANEL
CO2: 19 mEq/L (ref 19–32)
Chloride: 106 mEq/L (ref 96–112)
Creatinine, Ser: 0.81 mg/dL (ref 0.50–1.10)
Sodium: 138 mEq/L (ref 135–145)

## 2012-07-15 LAB — CSF CULTURE W GRAM STAIN

## 2012-07-15 MED ORDER — ONDANSETRON 4 MG PO TBDP
8.0000 mg | ORAL_TABLET | Freq: Once | ORAL | Status: AC
  Administered 2012-07-15: 8 mg via ORAL
  Filled 2012-07-15: qty 1
  Filled 2012-07-15: qty 2

## 2012-07-15 NOTE — ED Notes (Signed)
[facility] arrived to transfer pt. Requisitions printed and given to Radiology tech from fluoro to send csf fluid to lab upon completion. Rancour called to confirm if he wants open and closing pressure with the lumbar puncture, rancour confirmed that he did. Pt transported to [facility].

## 2012-07-15 NOTE — ED Notes (Signed)
Consent signed for Lumbar Puncture

## 2012-07-15 NOTE — ED Notes (Addendum)
Pt c/o headache and inicreased loss of vision.  Has been here several times for same.   Pt has appt for MRI tomorrow but st's she couldn't wait for them. Had appt at eye doctor today but felt to bad.

## 2012-07-15 NOTE — ED Provider Notes (Signed)
This chart was scribed for Glynn Octave, MD by Shari Heritage, ED Scribe. The patient was seen in room A13C/A13C. Patient was evaluated at 9:51 PM.  Gabriela Cantu is a 22 y.o. female with a history of pseudo tumor cerebri complaining of gradual, persistent, worsening headache and increased loss of vision in right eye onset yeasterday. There is associated nausea, vomiting and chills. Patient denies fever. Patient had a lumbar puncture on 07/11/12 and had some improvement until yesterday. Patient saw a neurologist with [facility] yesterday and was given Diamox 250 mg, but states worsening symptoms.  Physical Exam: Pupils are sluggish. Bilateral papilledema. 5/5 strength throughout. CN 2-12 intact. No ataxia on finger to nose. No meningismus.  Pseudotumor cerebri headache with visual changes and vomiting. Papilledema on exam.  IR contacted for large volume LP as difficult bedside attempt on last presentation. D/w Dr. Thad Ranger.  I personally performed the services described in this documentation, which was scribed in my presence. The recorded information has been reviewed and is accurate.    Glynn Octave, MD 07/16/12 [phone]

## 2012-07-15 NOTE — ED Provider Notes (Signed)
History     CSN: [id number]  Arrival date & time 07/15/12  1520   First MD Initiated Contact with Patient 07/15/12 2026      Chief Complaint  Patient presents with  . Headache    (Consider location/radiation/quality/duration/timing/severity/associated sxs/prior treatment) Patient is a 22 y.o. female presenting with headaches. The history is provided by the patient and medical records.  Headache  Associated symptoms include nausea and vomiting. Pertinent negatives include no fever and no shortness of breath.    Gabriela Cantu is a 22 y.o. female  with a hx of presumed pseudo tumor cerebri presents to the Emergency Department complaining of gradual, persistent, progressively worsening headache, vision changes onset yesterday.  Pt saw a neurologist yesterday and was given Diamox 250mg  but pt states she has been throwing up since 5am today.  Pt was given the diamox 125mg  in the ED when she was seen on 07/11/12 without difficulty. Associated symptoms include headache, nausea, vomiting, increased vision changes.  Nothing makes it better and nothing makes it worse.  Pt denies chest pain, shortness of breath, abdominal.  Pt states she is unable to keep even water down.  Pt continues to make urine.  Reported bilateral papilledema and optic nerve edema noted by ophthalmology, Maris Berger.  Pt given LP under fluoro on 07/11/12    History reviewed. No pertinent past medical history.  Past Surgical History  Procedure Date  . No surgical history Jan 2014    No family history on file.  History  Substance Use Topics  . Smoking status: Never Smoker   . Smokeless tobacco: Never Used  . Alcohol Use: No    OB History    Grav Para Term Preterm Abortions TAB SAB Ect Mult Living                  Review of Systems  Constitutional: Negative for fever, diaphoresis, appetite change, fatigue and unexpected weight change.  HENT: Negative for mouth sores, neck pain and neck stiffness.   Eyes:  Positive for visual disturbance.  Respiratory: Negative for cough, chest tightness, shortness of breath and wheezing.   Cardiovascular: Negative for chest pain.  Gastrointestinal: Positive for nausea and vomiting. Negative for abdominal pain, diarrhea and constipation.  Genitourinary: Negative for dysuria, urgency, frequency and hematuria.  Musculoskeletal: Negative for back pain.  Skin: Negative for rash.  Neurological: Positive for headaches. Negative for syncope and light-headedness.  Hematological: Does not bruise/bleed easily.  Psychiatric/Behavioral: Negative for sleep disturbance. The patient is not nervous/anxious.   All other systems reviewed and are negative.    Allergies  Review of patient's allergies indicates no known allergies.  Home Medications   Current Outpatient Rx  Name  Route  Sig  Dispense  Refill  . ACETAMINOPHEN 500 MG PO TABS   Oral   Take 500 mg by mouth every 6 (six) hours as needed. For pain         . ACETAZOLAMIDE 250 MG PO TABS      2 po bid   120 tablet   2   . FLUTICASONE PROPIONATE 50 MCG/ACT NA SUSP   Nasal   Place 2 sprays into the nose daily.   16 g   2   . HYDROCODONE-ACETAMINOPHEN 5-325 MG PO TABS   Oral   Take 1 tablet by mouth every 6 (six) hours as needed. For pain.         Marland Kitchen LORATADINE 10 MG PO TABS   Oral   Take 1  tablet (10 mg total) by mouth daily.   30 tablet   0   . NAPROXEN 500 MG PO TABS   Oral   Take 1 tablet (500 mg total) by mouth 2 (two) times daily with a meal.   20 tablet   0   . HYDROCODONE-ACETAMINOPHEN 5-325 MG PO TABS   Oral   Take 2 tablets by mouth every 4 (four) hours as needed for pain.   10 tablet   0   . ONDANSETRON 4 MG PO TBDP   Oral   Take 1 tablet (4 mg total) by mouth every 8 (eight) hours as needed for nausea.   10 tablet   0     BP 122/59  Pulse 79  Temp 98.6 F (37 C) (Oral)  Resp 16  SpO2 100%  LMP 05/24/2012  Physical Exam  Nursing note and vitals  reviewed. Constitutional: She is oriented to person, place, and time. She appears well-developed and well-nourished. No distress.  HENT:  Head: Normocephalic and atraumatic.  Mouth/Throat: Oropharynx is clear and moist. No oropharyngeal exudate.  Eyes: Conjunctivae normal and EOM are normal. No scleral icterus. Right pupil is not reactive. Left pupil is not reactive.  Fundoscopic exam:      The right eye shows hemorrhage and papilledema. The right eye shows no AV nicking and no exudate.       The left eye shows hemorrhage and papilledema. The left eye shows no AV nicking and no exudate.  Neck: Normal range of motion. Neck supple.  Cardiovascular: Normal rate, regular rhythm, normal heart sounds and intact distal pulses.  Exam reveals no gallop and no friction rub.   No murmur heard. Pulmonary/Chest: Effort normal and breath sounds normal. No respiratory distress. She has no wheezes. She has no rales. She exhibits no tenderness.  Abdominal: Soft. Bowel sounds are normal. She exhibits no mass. There is tenderness (mild, epigastric). There is no rebound and no guarding.  Musculoskeletal: Normal range of motion. She exhibits no edema.  Neurological: She is alert and oriented to person, place, and time. She has normal reflexes. No cranial nerve deficit. She exhibits normal muscle tone. Coordination normal.       Speech is clear and goal oriented, follows commands Major Cranial nerves without deficit, no facial droop Normal strength in upper and lower extremities bilaterally including dorsiflexion and plantar flexion, strong and equal grip strength Sensation normal to light and sharp touch Moves extremities without ataxia, coordination intact Normal finger to nose and rapid alternating movements Neg modified romberg, no pronator drift Normal gait and balance  Skin: Skin is warm and dry. No rash noted. She is not diaphoretic. No erythema.  Psychiatric: She has a normal mood and affect.    ED  Course  Procedures (including critical care time)  Labs Reviewed  CSF CELL COUNT WITH DIFFERENTIAL - Abnormal; Notable for the following:    RBC Count, CSF 55 (*)     All other components within normal limits  BASIC METABOLIC PANEL - Abnormal; Notable for the following:    Glucose, Bld 104 (*)     All other components within normal limits  PROTEIN AND GLUCOSE, CSF  CSF CULTURE  GRAM STAIN   Dg Lumbar Puncture Fluoro Guide  07/16/2012  *RADIOLOGY REPORT*  Clinical Data:  22 year old female with headache, vision change and vomiting.  History of pseudotumor cerebri.  DIAGNOSTIC LUMBAR PUNCTURE UNDER FLUOROSCOPIC GUIDANCE  Fluoroscopy time:  0.1 minutes.  Technique:  Informed consent was obtained  from the patient prior to the procedure, including potential complications of headache, allergy, and pain.  With the patient prone, the lower back was prepped with Betadine. 1% Lidocaine was used for local anesthesia. Lumbar puncture was performed at the L3-L4 level using a 20 gauge needle with return of clear colorless CSF with an opening pressure of 61 cm water.   27 ml of CSF were obtained for laboratory studies. Closing pressure was less than 18 cm water. The patient tolerated the procedure well and there were no apparent complications.  IMPRESSION: Fluoroscopic guided lumbar puncture with elevated opening pressure. 27 ml of CSF was obtained. No immediate complication.   Original Report Authenticated By: Harmon Pier, M.D.     Results for orders placed during the hospital encounter of 07/15/12  CSF CELL COUNT WITH DIFFERENTIAL      Component Value Range   Tube # 3     Color, CSF COLORLESS  COLORLESS   Appearance, CSF CLEAR  CLEAR   Supernatant NOT INDICATED     RBC Count, CSF 55 (*) 0 /cu mm   WBC, CSF 2  0 - 5 /cu mm   Segmented Neutrophils-CSF TOO FEW TO COUNT, SMEAR AVAILABLE FOR REVIEW  0 - 6 %  PROTEIN AND GLUCOSE, CSF      Component Value Range   Glucose, CSF 69  43 - 76 mg/dL   Total   Protein, CSF 24  15 - 45 mg/dL  BASIC METABOLIC PANEL      Component Value Range   Sodium 138  135 - 145 mEq/L   Potassium 4.2  3.5 - 5.1 mEq/L   Chloride 106  96 - 112 mEq/L   CO2 19  19 - 32 mEq/L   Glucose, Bld 104 (*) 70 - 99 mg/dL   BUN 15  6 - 23 mg/dL   Creatinine, Ser 1.61  0.50 - 1.10 mg/dL   Calcium 09.6  8.4 - 04.5 mg/dL   GFR calc non Af Amer >90  >90 mL/min   GFR calc Af Amer >90  >90 mL/min   Ct Head Wo Contrast  07/11/2012  *RADIOLOGY REPORT*  Clinical Data: Headache and clinical suspicion of pseudotumor cerebri with papilledema on exam.  CT HEAD WITHOUT CONTRAST  Technique:  Contiguous axial images were obtained from the base of the skull through the vertex without contrast.  Comparison: 07/07/2012  Findings: The brain demonstrates no evidence of hemorrhage, infarction, edema, mass effect, extra-axial fluid collection, hydrocephalus or mass lesion.  The skull is unremarkable.  IMPRESSION: Stable and normal head CT.   Original Report Authenticated By: Irish Lack, M.D.    Ct Head Wo Contrast  07/07/2012  *RADIOLOGY REPORT*  Clinical Data: 22 year old female with severe headache, blurred vision and dizziness.  CT HEAD WITHOUT CONTRAST  Technique:  Contiguous axial images were obtained from the base of the skull through the vertex without contrast.  Comparison: None  Findings: No intracranial abnormalities are identified, including mass lesion or mass effect, hydrocephalus, extra-axial fluid collection, midline shift, hemorrhage, or acute infarction.  The visualized bony calvarium is unremarkable.  IMPRESSION: Unremarkable noncontrast head CT.   Original Report Authenticated By: Harmon Pier, M.D.    Dg Lumbar Puncture Fluoro Guide  07/16/2012  *RADIOLOGY REPORT*  Clinical Data:  22 year old female with headache, vision change and vomiting.  History of pseudotumor cerebri.  DIAGNOSTIC LUMBAR PUNCTURE UNDER FLUOROSCOPIC GUIDANCE  Fluoroscopy time:  0.1 minutes.  Technique:  Informed  consent was obtained from the patient  prior to the procedure, including potential complications of headache, allergy, and pain.  With the patient prone, the lower back was prepped with Betadine. 1% Lidocaine was used for local anesthesia. Lumbar puncture was performed at the L3-L4 level using a 20 gauge needle with return of clear colorless CSF with an opening pressure of 61 cm water.   27 ml of CSF were obtained for laboratory studies. Closing pressure was less than 18 cm water. The patient tolerated the procedure well and there were no apparent complications.  IMPRESSION: Fluoroscopic guided lumbar puncture with elevated opening pressure. 27 ml of CSF was obtained. No immediate complication.   Original Report Authenticated By: Harmon Pier, M.D.    Dg Fluoro Guide Lumbar Puncture  07/11/2012  *RADIOLOGY REPORT*  Clinical Data:  Headache, papilledema and visual loss.  DIAGNOSTIC LUMBAR PUNCTURE UNDER FLUOROSCOPIC GUIDANCE  Fluoroscopy time:  46 seconds.  Technique:  Informed consent was obtained from the patient prior to the procedure, including potential complications of headache, allergy, and pain.   With the patient prone, the lower back was prepped with Betadine.  1% Lidocaine was used for local anesthesia.  Lumbar puncture was performed at the L2-3 level from a right interlaminar approach using a 20 gauge gauge needle with return of initially blood tinged CSF with an opening pressure of 40 cm water. 15 ml of CSF were obtained for laboratory studies.  Including CSF used for pressure measurement and waste, a total of approximately 20 ml of CSF was removed in total.  CSF did clear during collection.  Closing pressure was 14 cm water.  The patient tolerated the procedure well and there were no apparent complications.  IMPRESSION: Lumbar puncture performed under fluoroscopy.  Estimated pressure was reduced from 40 cm water to 14 cm water after CSF collection.   Original Report Authenticated By: Irish Lack,  M.D.       1. Pseudotumor cerebri       MDM  Gabriela Amodio presents with presumed psudo tumor cerebri.  Discussed with Dr. Thad Ranger of neurology. We will proceed with LP under fluoroscopy. Patient and mother asking for permission for symptom control however patient feels better after LP she may be discharged home with outpatient followup.  LP under fluoro: Opening pressure of 61 cm water. 27 ml of CSF were obtained for laboratory studies. Closing pressure was less than 18 cm water.  Pt states she feels better at this time.  Will give Vicodin for headache.  Pt with improved vision.  Pt has outpatient MRI scheduled for 12 noon tomorrow.  Dr Thad Ranger suggests that pt be d/c home with outpatient MRI and follow-up.  Patient alert, oriented, NAD, nontoxic, nonseptic appearing. Patient ambulates without difficulty.  I have also discussed reasons to return immediately to the ER.  Patient expresses understanding and agrees with plan.   1. Medications: vicodin, zofran, usual home medications 2. Treatment: rest, drink plenty of fluids, lay flat on your back for 2 hours after your LP 3. Follow Up: Please followup with your primary doctor for discussion of your diagnoses and further evaluation after today's visit; please attend your MRI at 12:00 noon tomorrow here at [facility] as instructed; also f/u with Otila Back Benett Swoyer, PA-C 07/16/12 0113  Dierdre Forth, PA-C 07/16/12 1610

## 2012-07-15 NOTE — Telephone Encounter (Signed)
Per Dr. Smiley Houseman, ED is probably best route at this point.  HA is constant whether lying or standing.  Pt states understanding.

## 2012-07-15 NOTE — Telephone Encounter (Signed)
Pt feeling worse today than yesterday.  She started vomiting after starting the diamox and could not have her visual field testing done today at the eye doctor.  Her vision is worse today than yesterday.  Her aunt is going to take her back to the ED.

## 2012-07-15 NOTE — ED Notes (Signed)
Pt remains in Fluoro for Lumbar puncture at this time. Unable to hourly round.

## 2012-07-15 NOTE — ED Notes (Signed)
Pt returned from fluoro. CSF sent to lab

## 2012-07-15 NOTE — Consult Note (Signed)
Reason for Consult:Pseudotumor cerebri with worsening symptoms Referring Physician: Rancour  CC: Worsening vision, nausea, vomiting, headache  HPI: Gabriela Cantu is an 22 y.o. female initially seen by neurology on 07/08/2012 with headache, blurred vision and papilledema.  CT was unremarkable.  Patient was felt to have pseudotumor cerebri and was started on Diamox.  LP was performed on 1/25 with an opening pressure of 40 and CSF was removed with a closing pressure of 14.  Patient reported improvement on her symptoms after the LP initially but all of her symptoms have returned.  Was seen by her neurologist on yesterday and her diamox was increased.  Despite this her symptoms have worsened.  She reports that her vision is worse.  Her headache is holocranial and throbbing with associated nausea, vomiting.  She also reports photophobia.   Patient scheduled for outpatient MRI tomorrow.  Past medical history: noncontributory  Past Surgical History  Procedure Date  . No surgical history Jan 2014    Family history is noncontributory  Social History:  reports that she has never smoked. She has never used smokeless tobacco. She reports that she does not drink alcohol. Her drug history not on file.  No Known Allergies  Medications: I have reviewed the patient's current medications. Prior to Admission:  Current outpatient prescriptions:acetaminophen (TYLENOL) 500 MG tablet, Take 500 mg by mouth every 6 (six) hours as needed. For pain, Disp: , Rfl: ;  acetaZOLAMIDE (DIAMOX) 250 MG tablet, 2 po bid, Disp: 120 tablet, Rfl: 2;  fluticasone (FLONASE) 50 MCG/ACT nasal spray, Place 2 sprays into the nose daily., Disp: 16 g, Rfl: 2 HYDROcodone-acetaminophen (NORCO/VICODIN) 5-325 MG per tablet, Take 1 tablet by mouth every 6 (six) hours as needed. For pain., Disp: , Rfl: ;  loratadine (CLARITIN) 10 MG tablet, Take 1 tablet (10 mg total) by mouth daily., Disp: 30 tablet, Rfl: 0;  naproxen (NAPROSYN) 500 MG tablet,  Take 1 tablet (500 mg total) by mouth 2 (two) times daily with a meal., Disp: 20 tablet, Rfl: 0  ROS: History obtained from the patient  General ROS: negative for - chills, fatigue, fever, night sweats, weight gain or weight loss Psychological ROS: negative for - behavioral disorder, hallucinations, memory difficulties, mood swings or suicidal ideation Ophthalmic ROS: as noted in HPI ENT ROS: negative for - epistaxis, nasal discharge, oral lesions, sore throat, tinnitus or vertigo Allergy and Immunology ROS: negative for - hives or itchy/watery eyes Hematological and Lymphatic ROS: negative for - bleeding problems, bruising or swollen lymph nodes Endocrine ROS: negative for - galactorrhea, hair pattern changes, polydipsia/polyuria or temperature intolerance Respiratory ROS: negative for - cough, hemoptysis, shortness of breath or wheezing Cardiovascular ROS: negative for - chest pain, dyspnea on exertion, edema or irregular heartbeat Gastrointestinal ROS: as noted in HPI Genito-Urinary ROS: negative for - dysuria, hematuria, incontinence or urinary frequency/urgency Musculoskeletal ROS: negative for - joint swelling or muscular weakness Neurological ROS: as noted in HPI Dermatological ROS: negative for rash and skin lesion changes  Physical Examination: Blood pressure 123/68, pulse 76, temperature 98.5 F (36.9 C), temperature source Oral, last menstrual period 05/24/2012, SpO2 100.00%.  Neurologic Examination Mental Status: Alert, oriented, thought content appropriate.  Speech fluent without evidence of aphasia.  Able to follow 3 step commands without difficulty. Cranial Nerves: II: Papilledema on the left, less so on the right; Central vision loss bilaterally.  Pupils equal, round, reactive to light and accommodation III,IV, VI: ptosis not present, extra-ocular motions intact bilaterally V,VII: smile symmetric, facial light touch  sensation normal bilaterally VIII: hearing normal  bilaterally IX,X: gag reflex present XI: bilateral shoulder shrug XII: midline tongue extension Motor: Right : Upper extremity   5/5    Left:     Upper extremity   5/5  Lower extremity   5/5     Lower extremity   5/5 Tone and bulk:normal tone throughout; no atrophy noted Sensory: Pinprick and light touch intact throughout, bilaterally Deep Tendon Reflexes: 2+ and symmetric throughout Plantars: Right: downgoing   Left: downgoing Cerebellar: normal finger-to-nose and normal heel-to-shin test Gait: not tested CV: pulses palpable throughout   Laboratory Studies:   Basic Metabolic Panel: No results found for this basename: NA:5,K:5,CL:5,CO2:5,GLUCOSE:5,BUN:5,CREATININE:5,CALCIUM:3,MG:5,PHOS:5 in the last 168 hours  Liver Function Tests: No results found for this basename: AST:5,ALT:5,ALKPHOS:5,BILITOT:5,PROT:5,ALBUMIN:5 in the last 168 hours No results found for this basename: LIPASE:5,AMYLASE:5 in the last 168 hours No results found for this basename: AMMONIA:3 in the last 168 hours  CBC: No results found for this basename: WBC:5,NEUTROABS:5,HGB:5,HCT:5,MCV:5,PLT:5 in the last 168 hours  Cardiac Enzymes: No results found for this basename: CKTOTAL:5,CKMB:5,CKMBINDEX:5,TROPONINI:5 in the last 168 hours  BNP: No components found with this basename: POCBNP:5  CBG: No results found for this basename: GLUCAP:5 in the last 168 hours  Microbiology: Results for orders placed during the hospital encounter of 07/11/12  CSF CULTURE     Status: Normal   Collection Time   07/11/12  2:50 PM      Component Value Range Status Comment   Specimen Description CSF   Final    Special Requests NONE   Final    Gram Stain     Final    Value: CYTOSPIN PREP SLIDE WBC PRESENT, PREDOMINANTLY MONONUCLEAR     NO ORGANISMS SEEN     Performed at [facility]   Culture NO GROWTH 3 DAYS   Final    Report Status 07/15/2012 FINAL   Final   GRAM STAIN     Status: Normal   Collection Time   07/11/12   2:50 PM      Component Value Range Status Comment   Specimen Description CSF   Final    Special Requests NONE   Final    Gram Stain     Final    Value: CYTOSPIN PREP SLIDE     WBC PRESENT, PREDOMINANTLY MONONUCLEAR     NO ORGANISMS SEEN   Report Status 07/11/2012 FINAL   Final     Coagulation Studies: No results found for this basename: LABPROT:5,INR:5 in the last 72 hours  Urinalysis: No results found for this basename: COLORURINE:2,APPERANCEUR:2,LABSPEC:2,PHURINE:2,GLUCOSEU:2,HGBUR:2,BILIRUBINUR:2,KETONESUR:2,PROTEINUR:2,UROBILINOGEN:2,NITRITE:2,LEUKOCYTESUR:2 in the last 168 hours  Lipid Panel:     Component Value Date/Time   CHOL 159 07/07/2012 1800   TRIG 99 07/07/2012 1800   HDL 45 07/07/2012 1800   CHOLHDL 3.5 07/07/2012 1800   VLDL 20 07/07/2012 1800   LDLCALC 94 07/07/2012 1800    HgbA1C:  Lab Results  Component Value Date   HGBA1C 5.6 07/07/2012    Urine Drug Screen:   No results found for this basename: labopia, cocainscrnur, labbenz, amphetmu, thcu, labbarb    Alcohol Level: No results found for this basename: ETH:2 in the last 168 hours  Imaging: No results found.   Assessment/Plan:  22 year old female with presumed pseudotumor cerebri.  On Diamox with worsening symptoms likely representing an increase in intracranial pressure since her last LP.  Diamox recently increased and likely has not had enough time to be effective.  Must also question  whether any of her symptoms are related to her increase in Diamox.  Recommendations: 1. Repeat LP.  IR contacted.  She was a difficult attempt at bedside with her last presentation.  Large volume to be removed. 2. Would keep appointment for MRI 3. BMET tonight.  If within normal limits would continue Diamox at current dose with patient to follow up with outpatient neurologist tomorrow.    Thana Farr, MD [facility] [phone] 07/15/2012, 10:08 PM

## 2012-07-16 ENCOUNTER — Telehealth: Payer: Self-pay | Admitting: Neurology

## 2012-07-16 ENCOUNTER — Ambulatory Visit (HOSPITAL_COMMUNITY)
Admission: RE | Admit: 2012-07-16 | Discharge: 2012-07-16 | Disposition: A | Discharge status: Home / Self Care | Payer: Medicaid Other | Source: Ambulatory Visit | Attending: Neurology | Admitting: Neurology

## 2012-07-16 ENCOUNTER — Ambulatory Visit (HOSPITAL_COMMUNITY): Payer: Medicaid Other

## 2012-07-16 DIAGNOSIS — G932 Benign intracranial hypertension: Secondary | ICD-10-CM

## 2012-07-16 DIAGNOSIS — G9389 Other specified disorders of brain: Secondary | ICD-10-CM | POA: Insufficient documentation

## 2012-07-16 DIAGNOSIS — R112 Nausea with vomiting, unspecified: Secondary | ICD-10-CM | POA: Insufficient documentation

## 2012-07-16 DIAGNOSIS — R51 Headache: Secondary | ICD-10-CM | POA: Insufficient documentation

## 2012-07-16 DIAGNOSIS — I871 Compression of vein: Secondary | ICD-10-CM | POA: Insufficient documentation

## 2012-07-16 DIAGNOSIS — H4710 Unspecified papilledema: Secondary | ICD-10-CM | POA: Insufficient documentation

## 2012-07-16 DIAGNOSIS — H547 Unspecified visual loss: Secondary | ICD-10-CM | POA: Insufficient documentation

## 2012-07-16 LAB — GRAM STAIN

## 2012-07-16 LAB — CSF CELL COUNT WITH DIFFERENTIAL: Tube #: 3

## 2012-07-16 MED ORDER — ONDANSETRON 4 MG PO TBDP: 4.0000 mg | ORAL_TABLET | Freq: Three times a day (TID) | ORAL | Status: DC | PRN

## 2012-07-16 MED ORDER — HYDROCODONE-ACETAMINOPHEN 5-325 MG PO TABS: 2.0000 | ORAL_TABLET | ORAL | Status: DC | PRN

## 2012-07-16 MED ORDER — HYDROCODONE-ACETAMINOPHEN 5-325 MG PO TABS
2.0000 | ORAL_TABLET | Freq: Once | ORAL | Status: AC
  Administered 2012-07-16: 2 via ORAL
  Filled 2012-07-16: qty 2

## 2012-07-16 MED ORDER — ONDANSETRON 4 MG PO TBDP
8.0000 mg | ORAL_TABLET | Freq: Once | ORAL | Status: AC
  Administered 2012-07-16: 8 mg via ORAL
  Filled 2012-07-16: qty 2

## 2012-07-16 MED ORDER — GADOBENATE DIMEGLUMINE 529 MG/ML IV SOLN
20.0000 mL | Freq: Once | INTRAVENOUS | Status: AC
  Administered 2012-07-16: 20 mL via INTRAVENOUS

## 2012-07-16 NOTE — Telephone Encounter (Signed)
Benay Spice, RN 07/16/2012 4:26 PM Signed  Called the patient again and spoke with her. She states she is better today than yesterday. No further nausea and vomiting. She was able to complete her imaging studies today. She is tolerating the Diamox and is taking 2 BID. I stressed the importance of this medication and taking it as prescribed. She has not rescheduled with the eye doctor for the VF testing so I told her to please call them to reschedule as it is very important to keep a check on her vision. She states she will call and reschedule. I told the patient to call us for any problems/concerns. Again, she states she will.  **Dr. Arbutus Leas, Lorain Childes..... Benay Spice, RN 07/16/2012 11:12 AM Signed  Left a message for the patient to return my call. TAT, REBECCA, DO 07/16/2012 10:32 AM Signed  Jan, I looked at ER records. Pt went to ER day after we saw her b/c of HA. She apparently had n/v and could not keep diamox down. They did another LP with OP of 600. Please call pt and make sure that she is able now to keep diamox down and is taking 500 mg bid (2 of the 250 mg tabs bid). Also , will you see if she got her VF testing done and please get a copy. I believe Tiffany knows the place she went. Thanks!

## 2012-07-16 NOTE — Telephone Encounter (Signed)
The patient called and would like to speak with Dr. Arbutus Leas or her nurse.  Please call the patient at [phone].

## 2012-07-16 NOTE — Procedures (Signed)
27 ccs clearcolorless CSF  L3-4 OP - 60  CP < 15  No complications

## 2012-07-16 NOTE — Telephone Encounter (Signed)
Jan, I looked at ER records.  Pt went to ER day after we saw her b/c of HA.  She apparently had n/v and could not keep diamox down.  They did another LP with OP of 600.  Please call pt and make sure that she is able now to keep diamox down and is taking 500 mg bid (2 of the 250 mg tabs bid).  Also , will you see if she got her VF testing done and please get a copy. I believe Tiffany knows the place she went.  Thanks!

## 2012-07-16 NOTE — Telephone Encounter (Signed)
Left a message for the patient to return my call.  

## 2012-07-16 NOTE — Telephone Encounter (Signed)
Called the patient again and spoke with her. She states she is better today than yesterday. No further nausea and vomiting. She was able to complete her imaging studies today. She is tolerating the Diamox and is taking 2 BID. I stressed the importance of this medication and taking it as prescribed. She has not rescheduled with the eye doctor for the VF testing so I told her to please call them to reschedule as it is very important to keep a check on her vision. She states she will call and reschedule. I told the patient to call us for any problems/concerns. Again, she states she will. **Dr. Arbutus Leas, Lorain Childes...Marland KitchenMarland Kitchen

## 2012-07-16 NOTE — ED Provider Notes (Signed)
Medical screening examination/treatment/procedure(s) were conducted as a shared visit with non-physician practitioner(s) and myself.  I personally evaluated the patient during the encounter  See my additional note  Glynn Octave, MD 07/16/12 [phone]

## 2012-07-19 LAB — CSF CULTURE W GRAM STAIN

## 2012-07-20 ENCOUNTER — Ambulatory Visit (INDEPENDENT_AMBULATORY_CARE_PROVIDER_SITE_OTHER): Payer: Self-pay | Admitting: Neurology

## 2012-07-20 ENCOUNTER — Telehealth: Payer: Self-pay | Admitting: Neurology

## 2012-07-20 ENCOUNTER — Encounter: Payer: Self-pay | Admitting: Neurology

## 2012-07-20 VITALS — BP 110/60 | HR 74 | Temp 98.2°F | Resp 16 | Wt 261.0 lb

## 2012-07-20 DIAGNOSIS — H5340 Unspecified visual field defects: Secondary | ICD-10-CM

## 2012-07-20 DIAGNOSIS — E669 Obesity, unspecified: Secondary | ICD-10-CM

## 2012-07-20 DIAGNOSIS — G932 Benign intracranial hypertension: Secondary | ICD-10-CM

## 2012-07-20 NOTE — Telephone Encounter (Signed)
I received a call from the patient's ophthalmologist, Dr. Charlotte Sanes.  Last week, the patients vision was 20/60.  Today, it was dramatically decreased and the patient could only count fingers.  The ophthalmologist believes that it is because of the swelling which has tracked toward the macula.  She stated that the patient said the headache was better.  I asked the ophthalmologist if we should go ahead and consider shunt given the quick nature of vision loss.  I think that we should at least get a consult.  I talked with Dr. Venetia Maxon.  He is willing to work her in and see her tomorrow.  We subsequently called the patient.  She is actually coming in for a quick appointment today.

## 2012-07-20 NOTE — Patient Instructions (Addendum)
Your appointment with Dr. Venetia Maxon is tomorrow February 4th at 2:00pm.  [address] [phone].

## 2012-07-20 NOTE — Progress Notes (Signed)
Gabriela Cantu was seen today in neurologic f/u regarding pseudotumor cerebri.  She is accompanied by her uncle who supplements the hx.  She is seen today as an emergent work in.  I saw the patient last week and I doubled her Diamox to 500 mg twice a day.  The following day, she ended up going back to the emergency room because of intractable nausea and vomiting.  She could not even keep the Diamox down.  They did a repeat lumbar puncture and at this time the opening pressure was up to 600 mm of water, from the prior 400 mm of water.  After the tap, she felt better and continues to be free of headache now.  She has been taking her Diamox faithfully.  I got a call this afternoon from her ophthalmologist.  Only a week before, the patient's vision was 20/60 and now she is down to counting fingers.  Her ophthalmologist feels that the swelling has tracked over to the macular region.  She had MRI/MRV scanning since last visit.  *RADIOLOGY REPORT*   Clinical Data:  22 year old female with headache and suspicion of idiopathic intracranial hypertension.  Papilledema.  Worsening vision, nausea, vomiting, headache.   Comparison:  Head CTs 07/11/2012, 07/07/2012.  Report of the fluoroscopic guided lumbar punctures 07/15/2012, 07/11/2012.   MRI HEAD WITHOUT AND WITH CONTRAST   Technique:  Multiplanar, multiecho pulse sequences of the brain and surrounding structures were obtained without and with intravenous contrast.   Contrast: 20mL MULTIHANCE GADOBENATE DIMEGLUMINE 529 MG/ML IV SOLN .   Findings:  Normal cerebral volume. No restricted diffusion to suggest acute infarction.  No midline shift, mass effect, evidence of mass lesion, ventriculomegaly, extra-axial collection or acute intracranial hemorrhage.  Cervicomedullary junction and pituitary are within normal limits.  Major intracranial vascular flow voids are preserved, with no abnormal expansion or signal associated with the transverse or sigmoid  sinuses.     There is a small solitary focus of increased T2 and FLAIR hyperintensity in the anterior right frontal lobe white matter on series 6 image 15.  No associated enhancement.  Elsewhere normal gray and white matter signal. No abnormal enhancement identified.  Negative visualized cervical spine. *RADIOLOGY REPORT*   Clinical Data:  22 year old female with headache and suspicion of idiopathic intracranial hypertension.  Papilledema.  Worsening vision, nausea, vomiting, headache.   Comparison:  Head CTs 07/11/2012, 07/07/2012.  Report of the fluoroscopic guided lumbar punctures 07/15/2012, 07/11/2012.   MRI HEAD WITHOUT AND WITH CONTRAST   Technique:  Multiplanar, multiecho pulse sequences of the brain and surrounding structures were obtained without and with intravenous contrast.   Contrast: 20mL MULTIHANCE GADOBENATE DIMEGLUMINE 529 MG/ML IV SOLN .   Findings:  Normal cerebral volume. No restricted diffusion to suggest acute infarction.  No midline shift, mass effect, evidence of mass lesion, ventriculomegaly, extra-axial collection or acute intracranial hemorrhage.  Cervicomedullary junction and pituitary are within normal limits.  Major intracranial vascular flow voids are preserved, with no abnormal expansion or signal associated with the transverse or sigmoid sinuses.     There is a small solitary focus of increased T2 and FLAIR hyperintensity in the anterior right frontal lobe white matter on series 6 image 15.  No associated enhancement.  Elsewhere normal gray and white matter signal. No abnormal enhancement identified.  Negative visualized cervical spine.   Prominence of the bilateral optic disc compatible with papilledema (series 12 image 24).  Bilateral optic nerves appear within normal limits.  Other orbit soft tissues are  normal.   Visualized paranasal sinuses and mastoids are clear.  Negative scalp soft tissues.  Diffusely decreased bone marrow signal  on T1- weighted images.  No abnormal marrow enhancement or focal suspicious marrow lesion identified.   IMPRESSION: 1.  Negative MRI appearance of the brain except for a solitary nonspecific focus of right frontal lobe white matter signal change. 2.  Prominence of the optic discs compatible with papilledema.  Otherwise negative orbit soft tissues. 3.  See MRV findings below.   MRV HEAD WITHOUT CONTRAST   Technique:  Angiographic images of the intracranial venous structures were obtained using MRV technique without intravenous contrast.   Findings:  Normal flow signal in the superior sagittal sinus, torcula, straight sinus, vein of Galen, internal cerebral veins and basal vein of Rosenthal.   Dominant transverse sinus drainage to the right.  Both transverse and sigmoid sinuses appear to remain patent, but there is bilateral moderate to severe stenosis along lateral margins of the transverse sinuses, near the junction with the sigmoid sinuses).  Both internal jugular bulbs and visualized internal jugular veins show preserved flow signal, and the right is dominant.  Major draining cortical veins appear to be patent, including the bilateral Trolard and Labbe veins.   IMPRESSION: No evidence of dural venous thrombosis, but there is moderate to severe stenosis at of both transverse sinuses, secondary phenomenon commonly observed in idiopathic intracranial hypertension.     Original Report Authenticated By: Erskine Speed, M.D.   PREVIOUS MEDICATIONS: Not applicable  ALLERGIES:  No Known Allergies  CURRENT MEDICATIONS:  Current Outpatient Prescriptions on File Prior to Visit  Medication Sig Dispense Refill  . acetaminophen (TYLENOL) 500 MG tablet Take 500 mg by mouth every 6 (six) hours as needed. For pain      . acetaZOLAMIDE (DIAMOX) 250 MG tablet 2 po bid  120 tablet  2  . fluticasone (FLONASE) 50 MCG/ACT nasal spray Place 2 sprays into the nose daily.  16 g  2  .  HYDROcodone-acetaminophen (NORCO/VICODIN) 5-325 MG per tablet Take 1 tablet by mouth every 6 (six) hours as needed. For pain.      Marland Kitchen HYDROcodone-acetaminophen (NORCO/VICODIN) 5-325 MG per tablet Take 2 tablets by mouth every 4 (four) hours as needed for pain.  10 tablet  0  . loratadine (CLARITIN) 10 MG tablet Take 1 tablet (10 mg total) by mouth daily.  30 tablet  0  . naproxen (NAPROSYN) 500 MG tablet Take 1 tablet (500 mg total) by mouth 2 (two) times daily with a meal.  20 tablet  0  . ondansetron (ZOFRAN ODT) 4 MG disintegrating tablet Take 1 tablet (4 mg total) by mouth every 8 (eight) hours as needed for nausea.  10 tablet  0    PAST MEDICAL HISTORY:  History reviewed. No pertinent past medical history.  PAST SURGICAL HISTORY:   Past Surgical History  Procedure Date  . No surgical history Jan 2014    SOCIAL HISTORY:   History   Social History  . Marital Status: Single    Spouse Name: N/A    Number of Children: N/A  . Years of Education: N/A   Occupational History  . housekeeping    Social History Main Topics  . Smoking status: Never Smoker   . Smokeless tobacco: Never Used  . Alcohol Use: No  . Drug Use: Not on file  . Sexually Active: Not on file   Other Topics Concern  . Not on file   Social History Narrative  .  No narrative on file    FAMILY HISTORY:   Family Status  Relation Status Death Age  . Mother Alive     congential renal disease  . Father Alive     ? medical health  . Sister Alive     3, alive and well    ROS:  A complete 10 system review of systems was obtained and was unremarkable apart from what is mentioned above.  PHYSICAL EXAMINATION:    VITALS:   Filed Vitals:   07/20/12 1535  Weight: 261 lb (118.389 kg)    GEN:  Normal appears female in no acute distress.  Appears stated age.  Very nervous. HEENT:  Normocephalic, atraumatic. The mucous membranes are moist. The superficial temporal arteries are without ropiness or  tenderness. Cardiovascular: Regular rate and rhythm. Lungs: Clear to auscultation bilaterally. Neck/Heme: There are no carotid bruits noted bilaterally.  NEUROLOGICAL: Orientation:  The patient is alert and oriented x 3.  Fund of knowledge is appropriate.  Recent and remote memory intact.  Attention span and concentration normal.  Repeats and names without difficulty. Cranial nerves: There is good facial symmetry. The pupils are equal, dilated and nonreactive as she just came from the eye dr and had eyes dilated. Fundoscopic exam reveals papilledema bilaterally. There is slight misalignment of the eyes in the neutral position, but extraocular muscles are intact without diplopia.  The visual fields are full when she covers the left eye.  When covering the right eye and viewing out of the left, she has complete left upper and lower quadrant VF loss.  Speech is fluent and clear. Soft palate rises symmetrically and there is no tongue deviation. Hearing is intact to conversational tone. Tone: Tone is good throughout. Sensation: Sensation is intact to light touch throughout Coordination:  The patient has no difficulty with RAM's or FNF bilaterally. Motor: Strength is at least antigravity x 4.   LABS:    Lab Results  Component Value Date   WBC 11.7* 07/08/2012   HGB 13.2 07/08/2012   HCT 36.2 07/08/2012   MCV 82.8 07/08/2012   PLT 326 07/08/2012     Chemistry      Component Value Date/Time   NA 138 07/15/2012 2147   K 4.2 07/15/2012 2147   CL 106 07/15/2012 2147   CO2 19 07/15/2012 2147   BUN 15 07/15/2012 2147   CREATININE 0.81 07/15/2012 2147      Component Value Date/Time   CALCIUM 10.3 07/15/2012 2147   ALKPHOS 76 07/07/2012 1800   AST 16 07/07/2012 1800   ALT 19 07/07/2012 1800   BILITOT 0.2* 07/07/2012 1800     Lab Results  Component Value Date   HGBA1C 5.6 07/07/2012   CSF analysis was unremarkable.  IMPRESSION/PLAN  1. pseudotumor cerebri.  -We discussed the diagnosis as well as  pathophysiology of the disease.  We discussed treatment options as well as prognostic indicators.  Patient education was provided.  -I am very concerned for the patient given rapid visual field loss.  I talked to Dr. Venetia Maxon about her and he is going to see her tomorrow to consider VP shunting.  The patient and I talked about this extensively today.  We talked about risks and benefits.    -I talked to the patient about the importance of weight loss and healthy diet and exercise.  -She will be continue on Diamox, 500 mg twice a day.  Risks, benefits, side effects and alternative therapies were discussed.  The opportunity to  ask questions was given and they were answered to the best of my ability.  The patient expressed understanding and willingness to follow the outlined treatment protocols.  -I wrote her a note to keep her out of work.  -She is to call me with any changes.   2.  Time in room with pt was 40 min with greater than 50% in counseling.

## 2012-07-22 ENCOUNTER — Encounter (HOSPITAL_COMMUNITY): Payer: Self-pay | Admitting: Pharmacy Technician

## 2012-07-22 ENCOUNTER — Other Ambulatory Visit: Payer: Self-pay | Admitting: Neurosurgery

## 2012-07-24 ENCOUNTER — Other Ambulatory Visit: Payer: Self-pay

## 2012-07-27 ENCOUNTER — Encounter (HOSPITAL_COMMUNITY)
Admission: RE | Admit: 2012-07-27 | Discharge: 2012-07-27 | Disposition: A | Discharge status: Home / Self Care | Payer: Medicaid Other | Source: Ambulatory Visit | Attending: Neurosurgery | Admitting: Neurosurgery

## 2012-07-27 ENCOUNTER — Encounter (HOSPITAL_COMMUNITY): Payer: Self-pay

## 2012-07-27 LAB — HCG, SERUM, QUALITATIVE: Preg, Serum: NEGATIVE

## 2012-07-27 LAB — SURGICAL PCR SCREEN
MRSA, PCR: NEGATIVE
Staphylococcus aureus: NEGATIVE

## 2012-07-27 LAB — CBC
Hemoglobin: 12.6 g/dL (ref 12.0–15.0)
RBC: 4.22 MIL/uL (ref 3.87–5.11)

## 2012-07-27 MED ORDER — CEFAZOLIN SODIUM-DEXTROSE 2-3 GM-% IV SOLR
2.0000 g | INTRAVENOUS | Status: AC
  Administered 2012-07-28: 2 g via INTRAVENOUS
  Filled 2012-07-27: qty 50

## 2012-07-27 NOTE — H&P (Signed)
NEUROSURGICAL CONSULTATION   Gabriela Cantu    DOB:  1990-07-01 #[id number]    July 22, 2012   HISTORY:     Gabriela Cantu is a 22 year old young woman I was asked to see on an emergent basis by Dr. Arbutus Leas for increasingly severe vision loss, with a diagnosis of pseudotumor cerebri.  She has had an MRI of her brain which shows small ventricles, no intracranial mass, and no Chiari malformation.  She has been to the ophthalmologist two weeks ago and then again this last week, and has had a decline in visual acuity between the two visits with papilledema on her initial evaluation.  She has been complaining of a headache and has had serial LPs.  She had an opening pressure of 60.  She has been taking Diamox, but despite being on Diamox has had progression of vision loss.  She is currently on Diamox 250 mg. two po b.i.d. and has been prescribed Hydrocodone 5/325 and Naprosyn 500 mg. which she is not taking.  She is also not taking Tylenol. She says headaches were of increasing severity on 06/20/2012. She went to the Urgent Care last week because of this.  The ophthalmologist was Dr. Maris Berger.    REVIEW OF SYSTEMS:   A detailed Review of Systems sheet was reviewed with the patient.  Pertinent positives include no other pertinent positives.  All other systems are negative; this includes Constitutional symptoms, Eyes, Cardiovascular, Ears, nose, mouth, throat, Endocrine, Respiratory, Gastrointestinal, Genitourinary, Musculoskeletal, Integumentary & Breast, Neurologic, Psychiatric, Hematologic/Lymphatic, Allergic/Immunologic.    PAST MEDICAL HISTORY:      Current Medical Conditions:    As previously described above.     Medications and Allergies:  Medications currently are Tylenol 500 mg. po q6h prn pain, Diamox 250 mg. b.i.d., Flonase 50 mcg. nasal spray two sprays qd, Hydrocodone 5/325 one po q6h prn pain and two po q4h prn pain, Claritin 10 mg. qd, Naprosyn 500 mg. b.i.d., and Zofran 4 mg. po q8h prn nausea.       Height and Weight:     She is 5'2" tall, 261 lbs. with a BMI of 47.5.    SOCIAL HISTORY:    She denies tobacco, alcohol, or drug use.    PHYSICAL EXAMINATION:      General Appearance:   On examination today, Gabriela Cantu is a morbidly obese Hispanic woman in no acute distress.      Blood Pressure, Pulse:     Blood pressure is 138/70.  Heart rate is 74 and regular. Respiratory rate is 18.      HEENT - normocephalic, atraumatic.  The pupils are equal, round and reactive to light.  The extraocular muscles are intact.  Sclerae - white.  Conjunctiva - pink.  Oropharynx benign.  Uvula midline.     Neck - there are no masses, meningismus, deformities, tracheal deviation, jugular vein distention or carotid bruits.  There is normal cervical range of motion.  Spurlings' test is negative without reproducible radicular pain turning the patient's head to either side.  Lhermitte's sign is not present with axial compression.      Respiratory - there is normal respiratory effort with good intercostal function.  Lungs are clear to auscultation.  There are no rales, rhonchi or wheezes.      Cardiovascular - the heart has regular rate and rhythm to auscultation.  No murmurs are appreciated.  There is no extremity edema, cyanosis or clubbing.  There are palpable pedal pulses.  Abdomen - obese, soft, nontender, no hepatosplenomegaly appreciated or masses.  There are active bowel sounds.  No guarding or rebound.      Musculoskeletal Examination - the patient is able to walk about the examining room with a normal, casual, heel and toe gait.    NEUROLOGICAL EXAMINATION: The patient is oriented to time, person and place and has good recall of both recent and remote memory with normal attention span and concentration.  The patient speaks with clear and fluent speech and exhibits normal language function and appropriate fund of knowledge.      Cranial Nerve Examination - pupils are equal, round and reactive to  light.  Extraocular movements are full.  Visual fields are full to confrontational testing. She does have papilledema on funduscopic examination bilaterally and she is able to read only with keeping the print six inches from her face. She says this is a significant decline from her premorbid condition.  Facial sensation and facial movement are symmetric and intact.  Hearing is intact to finger rub.  Palate is upgoing.  Shoulder shrug is symmetric.  Tongue protrudes in the midline.      Motor Examination - motor strength is 5/5 in the bilateral deltoids, biceps, triceps, handgrips, wrist extensors, interosseous.  In the lower extremities motor strength is 5/5 in hip flexion, extension, quadriceps, hamstrings, plantar flexion, dorsiflexion and extensor hallucis longus.      Sensory Examination - normal to light touch and pinprick sensation in the upper and lower extremities.     Deep Tendon Reflexes - 2 in the biceps, triceps, and brachioradialis, 2 in the knees, 2 in the ankles.  The great toes are downgoing to plantar stimulation.      Cerebellar Examination - normal coordination in upper and lower extremities and normal rapid alternating movements.  Romberg test is negative.    IMPRESSION AND RECOMMENDATIONS: I spoke with Gabriela Cantu and her aunt at length about treatment options.  I do think that she needs to undergo urgent VP shunt placement and discussed lumboperitoneal shunting vs. VP shunting, and also serial lumbar punctures. I told her that if she feels that she is losing her vision or getting worsening headache before surgery that she go back through the emergency room and they could do another lumbar puncture, otherwise we would go ahead with surgery on 07/28/2012.  This will be done by myself and with Dr. Avel Peace performing laparoscopic catheter placement in the abdomen.  She wishes to proceed. Risks and benefits were discussed with the patient. She knows of the importance of weight loss.     [facility]    Danae Orleans. Venetia Maxon, M.D.  JDS:aft cc: Dr. Lurena Joiner Tat   Dr. Maris Berger

## 2012-07-27 NOTE — Pre-Procedure Instructions (Signed)
Gabriela Cantu  07/27/2012   Your procedure is scheduled on:  Tuesday July 28, 2012.  Report to [facility] at 11:00 AM. (physician request)  Call this number if you have problems the morning of surgery: [phone]   Remember:   Do not eat food or drink liquids after midnight.   Take these medicines the morning of surgery with A SIP OF WATER: Acetazolamide (Diamox)   Do not wear jewelry, make-up or nail polish.  Do not wear lotions, powders, or perfumes.   Do not shave 48 hours prior to surgery.   Do not bring valuables to the hospital.  Contacts, dentures or bridgework may not be worn into surgery.  Leave suitcase in the car. After surgery it may be brought to your room.  For patients admitted to the hospital, checkout time is 11:00 AM the day of discharge.   Patients discharged the day of surgery will not be allowed to drive home.  Name and phone number of your driver: Family/Friend  Special Instructions: Shower using CHG 2 nights before surgery and the night before surgery.  If you shower the day of surgery use CHG.  Use special wash - you have one bottle of CHG for all showers.  You should use approximately 1/3 of the bottle for each shower.   Please read over the following fact sheets that you were given: Pain Booklet, Coughing and Deep Breathing, MRSA Information and Surgical Site Infection Prevention

## 2012-07-28 ENCOUNTER — Inpatient Hospital Stay (HOSPITAL_COMMUNITY)
Admission: RE | Admit: 2012-07-28 | Discharge: 2012-08-01 | DRG: 032 | Disposition: A | Discharge status: Home / Self Care | Payer: Medicaid Other | Source: Ambulatory Visit | Attending: Neurosurgery | Admitting: Neurosurgery

## 2012-07-28 ENCOUNTER — Encounter (HOSPITAL_COMMUNITY)
Admission: RE | Disposition: A | Discharge status: Home / Self Care | Payer: Self-pay | Source: Ambulatory Visit | Attending: Neurosurgery

## 2012-07-28 ENCOUNTER — Encounter (HOSPITAL_COMMUNITY): Payer: Self-pay | Admitting: Surgery

## 2012-07-28 ENCOUNTER — Ambulatory Visit (HOSPITAL_COMMUNITY): Payer: Medicaid Other

## 2012-07-28 ENCOUNTER — Encounter (HOSPITAL_COMMUNITY): Payer: Self-pay

## 2012-07-28 DIAGNOSIS — G932 Benign intracranial hypertension: Secondary | ICD-10-CM

## 2012-07-28 DIAGNOSIS — H543 Unqualified visual loss, both eyes: Secondary | ICD-10-CM | POA: Diagnosis present

## 2012-07-28 DIAGNOSIS — Z6841 Body Mass Index (BMI) 40.0 and over, adult: Secondary | ICD-10-CM

## 2012-07-28 DIAGNOSIS — R51 Headache: Secondary | ICD-10-CM | POA: Diagnosis not present

## 2012-07-28 DIAGNOSIS — Z79899 Other long term (current) drug therapy: Secondary | ICD-10-CM

## 2012-07-28 HISTORY — PX: VENTRICULOPERITONEAL SHUNT: SHX204

## 2012-07-28 HISTORY — PX: LAPAROSCOPIC REVISION VENTRICULAR-PERITONEAL (V-P) SHUNT: SHX5924

## 2012-07-28 SURGERY — SHUNT INSERTION VENTRICULAR-PERITONEAL
Anesthesia: General | Site: Head | Wound class: Clean

## 2012-07-28 MED ORDER — ONDANSETRON HCL 4 MG PO TABS: 4.0000 mg | ORAL_TABLET | ORAL | Status: DC | PRN

## 2012-07-28 MED ORDER — PROPOFOL 10 MG/ML IV BOLUS
INTRAVENOUS | Status: DC | PRN
  Administered 2012-07-28: 50 mg via INTRAVENOUS
  Administered 2012-07-28: 100 mg via INTRAVENOUS
  Administered 2012-07-28: 50 mg via INTRAVENOUS

## 2012-07-28 MED ORDER — FENTANYL CITRATE 0.05 MG/ML IJ SOLN
INTRAMUSCULAR | Status: DC | PRN
  Administered 2012-07-28 (×2): 50 ug via INTRAVENOUS
  Administered 2012-07-28: 100 ug via INTRAVENOUS
  Administered 2012-07-28: 50 ug via INTRAVENOUS

## 2012-07-28 MED ORDER — SENNA 8.6 MG PO TABS
1.0000 | ORAL_TABLET | Freq: Two times a day (BID) | ORAL | Status: DC
  Administered 2012-07-28 – 2012-08-01 (×8): 8.6 mg via ORAL
  Filled 2012-07-28 (×10): qty 1

## 2012-07-28 MED ORDER — ARTIFICIAL TEARS OP OINT
TOPICAL_OINTMENT | OPHTHALMIC | Status: DC | PRN
  Administered 2012-07-28: 1 via OPHTHALMIC

## 2012-07-28 MED ORDER — ACETAMINOPHEN 325 MG PO TABS
650.0000 mg | ORAL_TABLET | ORAL | Status: DC | PRN
Start: 2012-07-28 — End: 2012-08-01

## 2012-07-28 MED ORDER — BUPIVACAINE HCL (PF) 0.5 % IJ SOLN
INTRAMUSCULAR | Status: DC | PRN
  Administered 2012-07-28: 5 mL
  Administered 2012-07-28: 4 mL

## 2012-07-28 MED ORDER — SODIUM CHLORIDE 0.9 % IV SOLN
500.0000 mg | Freq: Two times a day (BID) | INTRAVENOUS | Status: DC
  Administered 2012-07-28 – 2012-07-30 (×4): 500 mg via INTRAVENOUS
  Filled 2012-07-28 (×5): qty 5

## 2012-07-28 MED ORDER — ACETAMINOPHEN 650 MG RE SUPP: 650.0000 mg | RECTAL | Status: DC | PRN

## 2012-07-28 MED ORDER — LABETALOL HCL 5 MG/ML IV SOLN: 10.0000 mg | INTRAVENOUS | Status: DC | PRN

## 2012-07-28 MED ORDER — GLYCOPYRROLATE 0.2 MG/ML IJ SOLN
INTRAMUSCULAR | Status: DC | PRN
  Administered 2012-07-28: 0.6 mg via INTRAVENOUS

## 2012-07-28 MED ORDER — MEPERIDINE HCL 25 MG/ML IJ SOLN: 6.2500 mg | INTRAMUSCULAR | Status: DC | PRN

## 2012-07-28 MED ORDER — LIDOCAINE-EPINEPHRINE 1 %-1:100000 IJ SOLN
INTRAMUSCULAR | Status: DC | PRN
  Administered 2012-07-28: 5 mL

## 2012-07-28 MED ORDER — HYDROMORPHONE HCL PF 1 MG/ML IJ SOLN
0.2500 mg | INTRAMUSCULAR | Status: DC | PRN
  Administered 2012-07-28 (×2): 0.5 mg via INTRAVENOUS

## 2012-07-28 MED ORDER — DOCUSATE SODIUM 100 MG PO CAPS
100.0000 mg | ORAL_CAPSULE | Freq: Two times a day (BID) | ORAL | Status: DC
  Administered 2012-07-28 – 2012-08-01 (×7): 100 mg via ORAL
  Filled 2012-07-28 (×8): qty 1

## 2012-07-28 MED ORDER — MORPHINE SULFATE 2 MG/ML IJ SOLN: 1.0000 mg | INTRAMUSCULAR | Status: DC | PRN

## 2012-07-28 MED ORDER — HYDROCODONE-ACETAMINOPHEN 5-325 MG PO TABS
1.0000 | ORAL_TABLET | ORAL | Status: DC | PRN
  Administered 2012-07-28 – 2012-07-31 (×8): 1 via ORAL
  Filled 2012-07-28 (×8): qty 1

## 2012-07-28 MED ORDER — ONDANSETRON HCL 4 MG/2ML IJ SOLN: 4.0000 mg | INTRAMUSCULAR | Status: DC | PRN

## 2012-07-28 MED ORDER — ONDANSETRON HCL 4 MG/2ML IJ SOLN: 4.0000 mg | Freq: Once | INTRAMUSCULAR | Status: DC | PRN

## 2012-07-28 MED ORDER — DEXTROSE 5 % IV SOLN
INTRAVENOUS | Status: DC | PRN
  Administered 2012-07-28: 15:00:00 via INTRAVENOUS

## 2012-07-28 MED ORDER — ROCURONIUM BROMIDE 100 MG/10ML IV SOLN
INTRAVENOUS | Status: DC | PRN
  Administered 2012-07-28 (×2): 10 mg via INTRAVENOUS
  Administered 2012-07-28: 40 mg via INTRAVENOUS

## 2012-07-28 MED ORDER — LIDOCAINE HCL (CARDIAC) 20 MG/ML IV SOLN
INTRAVENOUS | Status: DC | PRN
  Administered 2012-07-28: 100 mg via INTRAVENOUS

## 2012-07-28 MED ORDER — OXYCODONE HCL 5 MG/5ML PO SOLN: 5.0000 mg | Freq: Once | ORAL | Status: DC | PRN

## 2012-07-28 MED ORDER — SODIUM CHLORIDE 0.9 % IR SOLN
Status: DC | PRN
  Administered 2012-07-28: 1000 mL

## 2012-07-28 MED ORDER — THROMBIN 20000 UNITS EX SOLR
CUTANEOUS | Status: DC | PRN
  Administered 2012-07-28: 16:00:00 via TOPICAL

## 2012-07-28 MED ORDER — LACTATED RINGERS IV SOLN
INTRAVENOUS | Status: DC | PRN
  Administered 2012-07-28 (×2): via INTRAVENOUS

## 2012-07-28 MED ORDER — PROMETHAZINE HCL 25 MG PO TABS: 12.5000 mg | ORAL_TABLET | ORAL | Status: DC | PRN

## 2012-07-28 MED ORDER — KCL IN DEXTROSE-NACL 20-5-0.45 MEQ/L-%-% IV SOLN
INTRAVENOUS | Status: DC
  Administered 2012-07-28 – 2012-07-29 (×2): via INTRAVENOUS
  Filled 2012-07-28 (×8): qty 1000

## 2012-07-28 MED ORDER — OXYCODONE HCL 5 MG PO TABS: 5.0000 mg | ORAL_TABLET | Freq: Once | ORAL | Status: DC | PRN

## 2012-07-28 MED ORDER — MIDAZOLAM HCL 5 MG/5ML IJ SOLN
INTRAMUSCULAR | Status: DC | PRN
  Administered 2012-07-28: 1.5 mg via INTRAVENOUS
  Administered 2012-07-28: 0.5 mg via INTRAVENOUS

## 2012-07-28 MED ORDER — HYDROMORPHONE HCL PF 1 MG/ML IJ SOLN
INTRAMUSCULAR | Status: AC
  Filled 2012-07-28: qty 1

## 2012-07-28 MED ORDER — CEFAZOLIN SODIUM-DEXTROSE 2-3 GM-% IV SOLR
2.0000 g | Freq: Three times a day (TID) | INTRAVENOUS | Status: AC
  Administered 2012-07-28 – 2012-07-29 (×2): 2 g via INTRAVENOUS
  Filled 2012-07-28 (×2): qty 50

## 2012-07-28 MED ORDER — ACETAZOLAMIDE 250 MG PO TABS
500.0000 mg | ORAL_TABLET | Freq: Two times a day (BID) | ORAL | Status: DC
  Administered 2012-07-28 – 2012-08-01 (×8): 500 mg via ORAL
  Filled 2012-07-28 (×10): qty 2

## 2012-07-28 MED ORDER — PANTOPRAZOLE SODIUM 40 MG IV SOLR
40.0000 mg | Freq: Every day | INTRAVENOUS | Status: DC
  Administered 2012-07-28: 40 mg via INTRAVENOUS
  Filled 2012-07-28: qty 40

## 2012-07-28 MED ORDER — NEOSTIGMINE METHYLSULFATE 1 MG/ML IJ SOLN
INTRAMUSCULAR | Status: DC | PRN
  Administered 2012-07-28: 4 mg via INTRAVENOUS

## 2012-07-28 SURGICAL SUPPLY — 90 items
ADH SKN CLS APL DERMABOND .7 (GAUZE/BANDAGES/DRESSINGS) ×2
APL SKNCLS STERI-STRIP NONHPOA (GAUZE/BANDAGES/DRESSINGS) ×2
BAG DECANTER FOR FLEXI CONT (MISCELLANEOUS) ×3 IMPLANT
BENZOIN TINCTURE PRP APPL 2/3 (GAUZE/BANDAGES/DRESSINGS) ×1 IMPLANT
BLADE SURG 10 STRL SS (BLADE) ×6 IMPLANT
BLADE SURG 11 STRL SS (BLADE) ×3 IMPLANT
BLADE SURG 15 STRL LF DISP TIS (BLADE) ×2 IMPLANT
BLADE SURG 15 STRL SS (BLADE) ×3
BLADE SURG ROTATE 9660 (MISCELLANEOUS) ×6 IMPLANT
BOOT SUTURE AID YELLOW STND (SUTURE) ×3 IMPLANT
BRUSH SCRUB EZ PLAIN DRY (MISCELLANEOUS) ×6 IMPLANT
BUR ACORN 6.0 PRECISION (BURR) ×3 IMPLANT
CANISTER SUCTION 2500CC (MISCELLANEOUS) ×3 IMPLANT
CATH ROBINSON RED A/P 10FR (CATHETERS) ×2 IMPLANT
CLOTH BEACON ORANGE TIMEOUT ST (SAFETY) ×3 IMPLANT
CONT SPEC 4OZ CLIKSEAL STRL BL (MISCELLANEOUS) ×1 IMPLANT
CORDS BIPOLAR (ELECTRODE) ×3 IMPLANT
COVER MAYO STAND STRL (DRAPES) ×3 IMPLANT
COVER TABLE BACK 60X90 (DRAPES) ×2 IMPLANT
DECANTER SPIKE VIAL GLASS SM (MISCELLANEOUS) ×3 IMPLANT
DERMABOND ADVANCED (GAUZE/BANDAGES/DRESSINGS) ×1
DERMABOND ADVANCED .7 DNX12 (GAUZE/BANDAGES/DRESSINGS) ×2 IMPLANT
DRAPE INCISE IOBAN 66X45 STRL (DRAPES) ×1 IMPLANT
DRAPE INCISE IOBAN 85X60 (DRAPES) ×4 IMPLANT
DRAPE ORTHO SPLIT 77X108 STRL (DRAPES) ×3
DRAPE POUCH INSTRU U-SHP 10X18 (DRAPES) ×3 IMPLANT
DRAPE SURG ORHT 6 SPLT 77X108 (DRAPES) ×2 IMPLANT
DRESSING TELFA 8X3 (GAUZE/BANDAGES/DRESSINGS) ×3 IMPLANT
DRSG OPSITE 4X5.5 SM (GAUZE/BANDAGES/DRESSINGS) ×5 IMPLANT
DRSG TEGADERM 4X4.75 (GAUZE/BANDAGES/DRESSINGS) ×1 IMPLANT
ELECT CAUTERY BLADE 6.4 (BLADE) ×3 IMPLANT
ELECT REM PT RETURN 9FT ADLT (ELECTROSURGICAL) ×3
ELECTRODE REM PT RTRN 9FT ADLT (ELECTROSURGICAL) ×2 IMPLANT
GAUZE SPONGE 2X2 8PLY STRL LF (GAUZE/BANDAGES/DRESSINGS) IMPLANT
GAUZE SPONGE 4X4 16PLY XRAY LF (GAUZE/BANDAGES/DRESSINGS) ×5 IMPLANT
GLOVE BIO SURGEON STRL SZ 6.5 (GLOVE) ×2 IMPLANT
GLOVE BIO SURGEON STRL SZ8 (GLOVE) ×3 IMPLANT
GLOVE BIOGEL PI IND STRL 6.5 (GLOVE) IMPLANT
GLOVE BIOGEL PI IND STRL 7.0 (GLOVE) IMPLANT
GLOVE BIOGEL PI IND STRL 8 (GLOVE) ×4 IMPLANT
GLOVE BIOGEL PI IND STRL 8.5 (GLOVE) ×2 IMPLANT
GLOVE BIOGEL PI INDICATOR 6.5 (GLOVE) ×1
GLOVE BIOGEL PI INDICATOR 7.0 (GLOVE) ×1
GLOVE BIOGEL PI INDICATOR 8 (GLOVE) ×3
GLOVE BIOGEL PI INDICATOR 8.5 (GLOVE) ×1
GLOVE ECLIPSE 7.5 STRL STRAW (GLOVE) ×3 IMPLANT
GLOVE ECLIPSE 8.0 STRL XLNG CF (GLOVE) ×6 IMPLANT
GLOVE EXAM NITRILE LRG STRL (GLOVE) IMPLANT
GLOVE EXAM NITRILE MD LF STRL (GLOVE) IMPLANT
GLOVE EXAM NITRILE XL STR (GLOVE) IMPLANT
GLOVE EXAM NITRILE XS STR PU (GLOVE) IMPLANT
GOWN BRE IMP SLV AUR LG STRL (GOWN DISPOSABLE) ×2 IMPLANT
GOWN BRE IMP SLV AUR XL STRL (GOWN DISPOSABLE) ×2 IMPLANT
GOWN STRL NON-REIN LRG LVL3 (GOWN DISPOSABLE) ×6 IMPLANT
GOWN STRL REIN 2XL LVL4 (GOWN DISPOSABLE) ×2 IMPLANT
HEMOSTAT SURGICEL 2X14 (HEMOSTASIS) IMPLANT
KIT BASIN OR (CUSTOM PROCEDURE TRAY) ×3 IMPLANT
KIT ROOM TURNOVER OR (KITS) ×3 IMPLANT
NDL HYPO 25X1 1.5 SAFETY (NEEDLE) ×2 IMPLANT
NEEDLE HYPO 25X1 1.5 SAFETY (NEEDLE) ×3 IMPLANT
NS IRRIG 1000ML POUR BTL (IV SOLUTION) ×3 IMPLANT
PACK EENT II TURBAN DRAPE (CUSTOM PROCEDURE TRAY) ×3 IMPLANT
PAD ARMBOARD 7.5X6 YLW CONV (MISCELLANEOUS) ×9 IMPLANT
PATTIES SURGICAL .5 X.5 (GAUZE/BANDAGES/DRESSINGS) IMPLANT
PEEL-AWAY INTRODUCER SET 9.0FR. ×1 IMPLANT
PENCIL BUTTON HOLSTER BLD 10FT (ELECTRODE) ×3 IMPLANT
SHEATH PERITONEAL INTRO 46 (MISCELLANEOUS) IMPLANT
SHEATH PERITONEAL INTRO 61 (MISCELLANEOUS) ×1 IMPLANT
SPONGE GAUZE 2X2 STER 10/PKG (GAUZE/BANDAGES/DRESSINGS) ×1
SPONGE GAUZE 4X4 12PLY (GAUZE/BANDAGES/DRESSINGS) ×3 IMPLANT
SPONGE LAP 4X18 X RAY DECT (DISPOSABLE) ×4 IMPLANT
SPONGE SURGIFOAM ABS GEL 100 (HEMOSTASIS) IMPLANT
STAPLER SKIN PROX WIDE 3.9 (STAPLE) ×3 IMPLANT
STRIP CLOSURE SKIN 1/2X4 (GAUZE/BANDAGES/DRESSINGS) ×3 IMPLANT
SUT BONE WAX W31G (SUTURE) ×1 IMPLANT
SUT ETHILON 3 0 FSL (SUTURE) ×2 IMPLANT
SUT ETHILON 3 0 PS 1 (SUTURE) IMPLANT
SUT MON AB 4-0 PC3 18 (SUTURE) ×1 IMPLANT
SUT NURALON 4 0 TR CR/8 (SUTURE) IMPLANT
SUT SILK 0 TIES 10X30 (SUTURE) ×3 IMPLANT
SUT SILK 2 0 FS (SUTURE) ×3 IMPLANT
SUT SILK 2 0 TIES 10X30 (SUTURE) ×3 IMPLANT
SUT VIC AB 2-0 CP2 18 (SUTURE) ×3 IMPLANT
SUT VIC AB 3-0 SH 8-18 (SUTURE) ×3 IMPLANT
SYR BULB 3OZ (MISCELLANEOUS) ×3 IMPLANT
SYR CONTROL 10ML LL (SYRINGE) ×4 IMPLANT
TOWEL OR 17X26 10 PK STRL BLUE (TOWEL DISPOSABLE) ×4 IMPLANT
TUBE CONNECTING 12X1/4 (SUCTIONS) ×3 IMPLANT
VALVE PROGRAM W DISTAL CATH (Prosthesis & Implant Heart) ×1 IMPLANT
WATER STERILE IRR 1000ML POUR (IV SOLUTION) ×3 IMPLANT

## 2012-07-28 NOTE — Transfer of Care (Signed)
Immediate Anesthesia Transfer of Care Note  Patient: Gabriela Cantu  Procedure(s) Performed: Procedure(s) with comments: SHUNT INSERTION VENTRICULAR-PERITONEAL (N/A) - Ventricular-peritoneal shunt placement with Dr. Abbey Chatters LAPAROSCOPIC REVISION VENTRICULAR-PERITONEAL (V-P) SHUNT (Left) - Ventricular-peritoneal shunt placement with Dr. Abbey Chatters  Patient Location: PACU  Anesthesia Type:General  Level of Consciousness: awake, alert , oriented and patient cooperative  Airway & Oxygen Therapy: Patient Spontanous Breathing and Patient connected to nasal cannula oxygen  Post-op Assessment: Report given to PACU RN, Post -op Vital signs reviewed and stable and Patient moving all extremities  Post vital signs: Reviewed and stable  Complications: neck pain

## 2012-07-28 NOTE — Op Note (Signed)
Operative Note  Gabriela Cantu female 22 y.o. 07/28/2012  PREOPERATIVE DX:  Increased intracranial pressure secondary to pseudotumor cerebri  POSTOPERATIVE DX:  Same  PROCEDURE:  Laparoscopic-assisted ventriculoperitoneal shunt placement         Surgeon: Adolph Pollack   CoSurgeon:  Maeola Harman, M.D.  Anesthesia: General endotracheal anesthesia  Indications:   This is a 22 year old female with symptomatic increased intracranial pressure due to pseudotumor cerebri.  She now presents for the above procedure.    Procedure Detail:  She was brought to the operating room placed supine on the operating table and general anesthetic was given. Dr. Venetia Maxon shaved some air from the scalp. The scalp neck chest and abdominal areas were sterilely prepped and draped.  Once Dr. Venetia Maxon had access to the ventricle and drainage of cerebrospinal fluid, I entered the peritoneal cavity in the left upper quadrant region using a 5 mm Optiview trocar 3 small incision. A pneumoperitoneum was created by insufflation of carbon dioxide gas. Inspection of the area under the trocar demonstrated no evidence of bleeding or organ injury. A second 5 mm trocar was placed in the left mid lateral abdomen under laparoscopic vision. A subcutaneous tunneler was passed from the head and neck to the epigastric region. A small incision was made in the epigastric region. The tunneler was brought out through the small incision. The ventriculoperitoneal shunt catheter was then passed from the head and neck area down to the small epigastric incision. It was draining cerebrospinal fluid.  Using a modified Seldinger technique, the catheter was placed into the peritoneal cavity through a small 5 mm epigastric incision and the peel-away sheath introducer removed. The catheter was then positioned into the pelvis under laparoscopic vision. It continued to drain cerebrospinal fluid.    A four-quadrant inspection was performed. There is no evidence  of bleeding or organ injury. The trochars were removed and the CO2 gas was placed. The skin incisions were closed with 4-0 Monocryl subcuticular stitches. Steri-Strips and sterile dressings were applied.  She tolerated the procedure well without any apparent complications and was taken to the recovery in satisfactory condition.  Estimated Blood Loss:  Minimal         Drains: ventriculoperitoneal shunt  Blood Given: none          Specimens: none        Complications:  * No complications entered in OR log *         Disposition: PACU - hemodynamically stable.         Condition: stable

## 2012-07-28 NOTE — Brief Op Note (Signed)
07/28/2012  3:56 PM  PATIENT:  Gabriela Cantu  22 y.o. female  PRE-OPERATIVE DIAGNOSIS:  Pseudotumor cerebri, Vision loss  POST-OPERATIVE DIAGNOSIS:  Pseudotumor cerebri, Vision loss  PROCEDURE:  Procedure(s) with comments: SHUNT INSERTION VENTRICULAR-PERITONEAL (N/A) - Ventricular-peritoneal shunt placement with Dr. Rosenbower LAPAROSCOPIC REVISION VENTRICULAR-PERITONEAL (V-P) SHUNT (Left) - Ventricular-peritoneal shunt placement with Dr. Rosenbower  SURGEON:  Surgeon(s) and Role: Panel 1:    * Nithila Sumners, MD - Primary  Panel 2:    * Todd J Rosenbower, MD - Primary  PHYSICIAN ASSISTANT:   ASSISTANTS: Poteat, RN   ANESTHESIA:   general  EBL:  Total I/O In: 1050 [I.V.:1050] Out: -   BLOOD ADMINISTERED:none  DRAINS: none   LOCAL MEDICATIONS USED:  LIDOCAINE   SPECIMEN:  No Specimen  DISPOSITION OF SPECIMEN:  N/A  COUNTS:  YES  TOURNIQUET:  * No tourniquets in log *  DICTATION: Indications: 22 year old female with pseudotumor cerebri and progressive vision loss despite diamox and multiple lumbar punctures and it was elected to take her to surgery for ventriculoperitoneal shunt placement. She is morbidly obese.  It was elected for patient to have laparoscopic assisted abdominal catheter placement.  Procedure:  Patient was brought to the operating room and underwent smooth and uncomplicated induction of general endotracheal anesthesia.  Her right scalp, chest and abdomen were shaved, prepped and draped in usual fashion with betadine scrub and paint.  Right frontal scalp was infiltrated with lidocaine and an incision was made over coronal suture at mid-pupillary line.  Trephine was created, dura was incised with electrocautery.  Shunt was passed to abdomen with a posterior auricular intervening incision.  Tunneler was utilized with cautious placement.  Ventricular catheter was placed with brisk  flow of CSF on the first pass. Catheter was hooked to valve and anchored with a  silk tie. Catheter had spontaneous flow of CSF.  Catheter was placed in abdominal cavity and incisions were closed by Dr. Rosenbower.  Cranial incisions were closed with 2-0 vicryl sutures and 3-0 Nylon sutures.  Sterile occlusive dressings were placed.  Patient was extubated and taken to recovery in stable condition having tolerated procedure well.  Counts were correct at the end of the case.  PLAN OF CARE: Admit to inpatient   PATIENT DISPOSITION:  PACU - hemodynamically stable.   Delay start of Pharmacological VTE agent (>24hrs) due to surgical blood loss or risk of bleeding: yes  

## 2012-07-28 NOTE — Consult Note (Signed)
Reason for Consult:  Increased intracranial pressure  Referring Physician: Maeola Harman, M.D.  Gabriela Cantu is an 22 y.o. female.  HPI:  She has increased intracranial pressure from a pseudotumor cerebri. This has affected her visual acuity. I've been asked to assist in placing a ventriculoperitoneal shunt.  Pseudotumor cerebri  Past Surgical History  Procedure Laterality Date  . No surgical history  Jan 2014    History reviewed. No pertinent family history.  Social History:  reports that she has never smoked. She has never used smokeless tobacco. She reports that she does not drink alcohol or use illicit drugs.  Allergies: No Known Allergies  Prior to Admission medications   Medication Sig Start Date End Date Taking? Authorizing Provider  acetaZOLAMIDE (DIAMOX) 250 MG tablet Take 500 mg by mouth 2 (two) times daily.   Yes Historical Provider, MD     Results for orders placed during the hospital encounter of 07/27/12 (from the past 48 hour(s))  SURGICAL PCR SCREEN     Status: None   Collection Time    07/27/12  1:14 PM      Result Value Range   MRSA, PCR NEGATIVE  NEGATIVE   Staphylococcus aureus NEGATIVE  NEGATIVE   Comment:            The Xpert SA Assay (FDA     approved for NASAL specimens     in patients over 26 years of age),     is one component of     a comprehensive surveillance     program.  Test performance has     been validated by [employer] for patients greater     than or equal to 76 year old.     It is not intended     to diagnose infection nor to     guide or monitor treatment.  CBC     Status: Abnormal   Collection Time    07/27/12  1:15 PM      Result Value Range   WBC 10.3  4.0 - 10.5 K/uL   RBC 4.22  3.87 - 5.11 MIL/uL   Hemoglobin 12.6  12.0 - 15.0 g/dL   HCT 69.6 (*) 29.5 - 28.4 %   MCV 82.5  78.0 - 100.0 fL   MCH 29.9  26.0 - 34.0 pg   MCHC 36.2 (*) 30.0 - 36.0 g/dL   RDW 13.2  44.0 - 10.2 %   Platelets 338  150 - 400 K/uL  HCG,  SERUM, QUALITATIVE     Status: None   Collection Time    07/27/12  1:15 PM      Result Value Range   Preg, Serum NEGATIVE  NEGATIVE   Comment:            THE SENSITIVITY OF THIS     METHODOLOGY IS >10 mIU/mL.    No results found.  Review of Systems  Constitutional: Positive for fever. Negative for chills.  Gastrointestinal: Negative for nausea, vomiting, abdominal pain and diarrhea.   Blood pressure 97/67, pulse 79, temperature 98.7 F (37.1 C), temperature source Oral, resp. rate 20, last menstrual period 05/24/2012, SpO2 100.00%. Physical Exam  Constitutional: No distress.  Morbidly obese.  HENT:  Head: Normocephalic and atraumatic.  Eyes: No scleral icterus.  GI: She exhibits no distension and no mass. There is no tenderness.  Obese.  Skin: Skin is warm and dry.    Assessment/Plan: Increased intracranial pressure secondary to pseudotumor cerebri.  Plan: Laparoscopic assisted ventriculoperitoneal shunt placement. The procedure and risks have been discussed with her. Risks include but are not limited to bleeding, infection, wound problems, anesthesia, injury to intra-abdominal organs. She seems to understand all this and agrees.  Rilea Arutyunyan J 07/28/2012, 1:54 PM

## 2012-07-28 NOTE — Anesthesia Procedure Notes (Addendum)
Procedure Name: Intubation Date/Time: 07/28/2012 2:29 PM Performed by: Darcey Nora B Pre-anesthesia Checklist: Patient identified, Emergency Drugs available, Suction available and Patient being monitored Patient Re-evaluated:Patient Re-evaluated prior to inductionOxygen Delivery Method: Circle system utilized Preoxygenation: Pre-oxygenation with 100% oxygen Intubation Type: IV induction Ventilation: Mask ventilation without difficulty Laryngoscope Size: Mac and 3 Grade View: Grade I Tube type: Oral Tube size: 7.5 mm Number of attempts: 1 Airway Equipment and Method: Stylet Placement Confirmation: ETT inserted through vocal cords under direct vision,  breath sounds checked- equal and bilateral and positive ETCO2 Secured at: 21 (cm at teeth) cm Tube secured with: Tape Dental Injury: Teeth and Oropharynx as per pre-operative assessment

## 2012-07-28 NOTE — Anesthesia Preprocedure Evaluation (Signed)
Anesthesia Evaluation  Patient identified by MRN, date of birth, ID band Patient awake    Reviewed: Allergy & Precautions, H&P , NPO status , Patient's Chart, lab work & pertinent test results, reviewed documented beta blocker date and time   Airway Mallampati: II TM Distance: >3 FB Neck ROM: Full    Dental  (+) Teeth Intact and Dental Advisory Given   Pulmonary          Cardiovascular     Neuro/Psych Acute vision loss over two week period secondary to pseudotumor.    GI/Hepatic   Endo/Other    Renal/GU      Musculoskeletal   Abdominal   Peds  Hematology   Anesthesia Other Findings   Reproductive/Obstetrics                           Anesthesia Physical Anesthesia Plan  ASA: II  Anesthesia Plan: General   Post-op Pain Management:    Induction: Intravenous  Airway Management Planned: Oral ETT  Additional Equipment:   Intra-op Plan:   Post-operative Plan: Extubation in OR  Informed Consent: I have reviewed the patients History and Physical, chart, labs and discussed the procedure including the risks, benefits and alternatives for the proposed anesthesia with the patient or authorized representative who has indicated his/her understanding and acceptance.   Dental advisory given  Plan Discussed with: Anesthesiologist and Surgeon  Anesthesia Plan Comments:         Anesthesia Quick Evaluation

## 2012-07-28 NOTE — Op Note (Signed)
07/28/2012  3:56 PM  PATIENT:  Gabriela Cantu  22 y.o. female  PRE-OPERATIVE DIAGNOSIS:  Pseudotumor cerebri, Vision loss  POST-OPERATIVE DIAGNOSIS:  Pseudotumor cerebri, Vision loss  PROCEDURE:  Procedure(s) with comments: SHUNT INSERTION VENTRICULAR-PERITONEAL (N/A) - Ventricular-peritoneal shunt placement with Dr. Abbey Chatters LAPAROSCOPIC REVISION VENTRICULAR-PERITONEAL (V-P) SHUNT (Left) - Ventricular-peritoneal shunt placement with Dr. Abbey Chatters  SURGEON:  Surgeon(s) and Role: Panel 1:    * Maeola Harman, MD - Primary  Panel 2:    * Adolph Pollack, MD - Primary  PHYSICIAN ASSISTANT:   ASSISTANTS: Poteat, RN   ANESTHESIA:   general  EBL:  Total I/O In: 1050 [I.V.:1050] Out: -   BLOOD ADMINISTERED:none  DRAINS: none   LOCAL MEDICATIONS USED:  LIDOCAINE   SPECIMEN:  No Specimen  DISPOSITION OF SPECIMEN:  N/A  COUNTS:  YES  TOURNIQUET:  * No tourniquets in log *  DICTATION: Indications: 21 year old female with pseudotumor cerebri and progressive vision loss despite diamox and multiple lumbar punctures and it was elected to take her to surgery for ventriculoperitoneal shunt placement. She is morbidly obese.  It was elected for patient to have laparoscopic assisted abdominal catheter placement.  Procedure:  Patient was brought to the operating room and underwent smooth and uncomplicated induction of general endotracheal anesthesia.  Her right scalp, chest and abdomen were shaved, prepped and draped in usual fashion with betadine scrub and paint.  Right frontal scalp was infiltrated with lidocaine and an incision was made over coronal suture at mid-pupillary line.  Trephine was created, dura was incised with electrocautery.  Shunt was passed to abdomen with a posterior auricular intervening incision.  Tunneler was utilized with cautious placement.  Ventricular catheter was placed with brisk  flow of CSF on the first pass. Catheter was hooked to valve and anchored with a  silk tie. Catheter had spontaneous flow of CSF.  Catheter was placed in abdominal cavity and incisions were closed by Dr. Abbey Chatters.  Cranial incisions were closed with 2-0 vicryl sutures and 3-0 Nylon sutures.  Sterile occlusive dressings were placed.  Patient was extubated and taken to recovery in stable condition having tolerated procedure well.  Counts were correct at the end of the case.  PLAN OF CARE: Admit to inpatient   PATIENT DISPOSITION:  PACU - hemodynamically stable.   Delay start of Pharmacological VTE agent (>24hrs) due to surgical blood loss or risk of bleeding: yes

## 2012-07-28 NOTE — Preoperative (Signed)
Beta Blockers   Reason not to administer Beta Blockers:Not Applicable 

## 2012-07-28 NOTE — Progress Notes (Signed)
Awake, alert, conversant.  PERRL, EOMI.  MAEW.  Doing well.  

## 2012-07-28 NOTE — Interval H&P Note (Signed)
History and Physical Interval Note:  07/28/2012 6:22 AM  Gabriela Cantu  has presented today for surgery, with the diagnosis of Pseudotumor, Vision loss  The various methods of treatment have been discussed with the patient and family. After consideration of risks, benefits and other options for treatment, the patient has consented to  Procedure(s) with comments: SHUNT INSERTION VENTRICULAR-PERITONEAL (N/A) - Ventricular-peritoneal shunt placement with Dr. Abbey Chatters LAPAROSCOPIC REVISION VENTRICULAR-PERITONEAL (V-P) SHUNT (Left) as a surgical intervention .  The patient's history has been reviewed, patient examined, no change in status, stable for surgery.  I have reviewed the patient's chart and labs.  Questions were answered to the patient's satisfaction.     Jaquavis Felmlee D

## 2012-07-29 ENCOUNTER — Telehealth (INDEPENDENT_AMBULATORY_CARE_PROVIDER_SITE_OTHER): Payer: Self-pay | Admitting: General Surgery

## 2012-07-29 MED ORDER — PANTOPRAZOLE SODIUM 40 MG PO TBEC
40.0000 mg | DELAYED_RELEASE_TABLET | Freq: Every day | ORAL | Status: DC
  Administered 2012-07-29 – 2012-08-01 (×4): 40 mg via ORAL
  Filled 2012-07-29 (×4): qty 1

## 2012-07-29 NOTE — Progress Notes (Signed)
Subjective: Patient reports doing well.  Feels like can't urinate.  Objective: Vital signs in last 24 hours: Temp:  [97.5 F (36.4 C)-98.7 F (37.1 C)] 98.5 F (36.9 C) (02/12 0400) Pulse Rate:  [69-103] 75 (02/12 0700) Resp:  [14-30] 14 (02/12 0700) BP: (86-129)/(50-77) 120/63 mmHg (02/12 0700) SpO2:  [94 %-100 %] 100 % (02/12 0700) Weight:  [121.8 kg (268 lb 8.3 oz)] 121.8 kg (268 lb 8.3 oz) (02/11 1710)  Intake/Output from previous day: 02/11 0701 - 02/12 0700 In: 2635 [I.V.:2325; IV Piggyback:310] Out: 100 [Blood:100] Intake/Output this shift:    Physical Exam: Awake, alert, conversant.  Minimal pain.  Hypoactive bowel sounds. Vision stable.  Lab Results:  Recent Labs  07/27/12 1315  WBC 10.3  HGB 12.6  HCT 34.8*  PLT 338   BMET No results found for this basename: NA, K, CL, CO2, GLUCOSE, BUN, CREATININE, CALCIUM,  in the last 72 hours  Studies/Results: No results found.  Assessment/Plan: Transfer to 4N.  Ultrasound bladder.  Advance diet and mobility.    LOS: 1 day    Dorian Heckle, MD 07/29/2012, 8:04 AM

## 2012-07-29 NOTE — Telephone Encounter (Signed)
Spoke with patient she is still in hospital she will call in for po f/u when released  .

## 2012-07-29 NOTE — Anesthesia Postprocedure Evaluation (Signed)
Anesthesia Post Note  Patient: Gabriela Cantu  Procedure(s) Performed: Procedure(s) (LRB): SHUNT INSERTION VENTRICULAR-PERITONEAL (N/A) LAPAROSCOPIC REVISION VENTRICULAR-PERITONEAL (V-P) SHUNT (Left)  Anesthesia type: general  Patient location: PACU  Post pain: Pain level controlled  Post assessment: Patient's Cardiovascular Status Stable  Last Vitals:  Filed Vitals:   07/29/12 1800  BP: 108/59  Pulse: 79  Temp:   Resp: 14    Post vital signs: Reviewed and stable  Level of consciousness: sedated  Complications: No apparent anesthesia complications

## 2012-07-29 NOTE — Progress Notes (Signed)
UR completed 

## 2012-07-29 NOTE — Progress Notes (Signed)
1 Day Post-Op  Subjective: Denies pain currently.  Objective: Vital signs in last 24 hours: Temp:  [97.5 F (36.4 C)-98.7 F (37.1 C)] 98.5 F (36.9 C) (02/12 0400) Pulse Rate:  [71-103] 73 (02/12 0500) Resp:  [15-30] 15 (02/12 0500) BP: (86-129)/(50-77) 92/54 mmHg (02/12 0500) SpO2:  [94 %-100 %] 100 % (02/12 0500) Weight:  [268 lb 8.3 oz (121.8 kg)] 268 lb 8.3 oz (121.8 kg) (02/11 1710)    Intake/Output from previous day: 02/11 0701 - 02/12 0700 In: 2485 [I.V.:2175; IV Piggyback:310] Out: 100 [Blood:100] Intake/Output this shift: Total I/O In: 905 [I.V.:750; IV Piggyback:155] Out: -   PE: General- In NAD Abdomen-soft, nontender, dressings dry  Lab Results:   Recent Labs  07/27/12 1315  WBC 10.3  HGB 12.6  HCT 34.8*  PLT 338   BMET No results found for this basename: NA, K, CL, CO2, GLUCOSE, BUN, CREATININE, CALCIUM,  in the last 72 hours PT/INR No results found for this basename: LABPROT, INR,  in the last 72 hours Comprehensive Metabolic Panel:    Component Value Date/Time   NA 138 07/15/2012 2147   K 4.2 07/15/2012 2147   CL 106 07/15/2012 2147   CO2 19 07/15/2012 2147   BUN 15 07/15/2012 2147   CREATININE 0.81 07/15/2012 2147   GLUCOSE 104* 07/15/2012 2147   CALCIUM 10.3 07/15/2012 2147   AST 16 07/07/2012 1800   ALT 19 07/07/2012 1800   ALKPHOS 76 07/07/2012 1800   BILITOT 0.2* 07/07/2012 1800   PROT 8.1 07/07/2012 1800   ALBUMIN 4.4 07/07/2012 1800     Studies/Results: No results found.  Anti-infectives: Anti-infectives   Start     Dose/Rate Route Frequency Ordered Stop   07/28/12 1830  ceFAZolin (ANCEF) IVPB 2 g/50 mL premix     2 g 100 mL/hr over 30 Minutes Intravenous Every 8 hours 07/28/12 1710 07/29/12 0233   07/28/12 0600  ceFAZolin (ANCEF) IVPB 2 g/50 mL premix     2 g 100 mL/hr over 30 Minutes Intravenous On call to O.R. 07/27/12 1429 07/28/12 1440      Assessment Active Problems:    Post laparoscopic VP shunt placement 07/28/12-doing  well    LOS: 1 day   Plan: May remove bandages tomorrow.  Follow up with me prn.   Yamil Oelke J 07/29/2012

## 2012-07-30 NOTE — Progress Notes (Signed)
Subjective: Patient reports "I have a little headache, thats all."  Objective: Vital signs in last 24 hours: Temp:  [97.9 F (36.6 C)-99.8 F (37.7 C)] 97.9 F (36.6 C) (02/13 0635) Pulse Rate:  [74-97] 85 (02/13 0240) Resp:  [14-19] 16 (02/13 0635) BP: (93-128)/(55-75) 109/63 mmHg (02/13 0635) SpO2:  [98 %-100 %] 100 % (02/13 0635)  Intake/Output from previous day: 02/12 0701 - 02/13 0700 In: 1790 [P.O.:640; I.V.:1050; IV Piggyback:100] Out: 1000 [Urine:1000] Intake/Output this shift:    Alert, conversant, sitting on edge of bed. Incisions without erythema, swelling, or drainage. MAEW. PEARL. EOMI. Drsgs removed.   Lab Results:  Recent Labs  07/27/12 1315  WBC 10.3  HGB 12.6  HCT 34.8*  PLT 338   BMET No results found for this basename: NA, K, CL, CO2, GLUCOSE, BUN, CREATININE, CALCIUM,  in the last 72 hours  Studies/Results: No results found.  Assessment/Plan: Improving   LOS: 2 days  Per Dr. Venetia Maxon, may d/c to home today if h/a resolved; otherwise plan on tomorrow. D/C Keppra, d/c IV. Pt understands d/c instructions & will call office to schedule f/u appt. Will continue Diamox per Dr. Venetia Maxon.   Georgiann Cocker 07/30/2012, 7:20 AM

## 2012-07-30 NOTE — Discharge Summary (Signed)
Physician Discharge Summary  Patient ID: Gabriela Cantu MRN: [id number] DOB/AGE: August 09, 1990 22 y.o.  Admit date: 07/28/2012 Discharge date: 07/30/2012  Admission Diagnoses: Pseudotumor cerebri, Vision loss    Discharge Diagnoses: Pseudotumor cerebri, Vision loss s/p SHUNT INSERTION VENTRICULAR-PERITONEAL (N/A) - Ventricular-peritoneal shunt placement with Dr. Abbey Chatters LAPAROSCOPIC REVISION VENTRICULAR-PERITONEAL (V-P) SHUNT (Left) - Ventricular-peritoneal shunt placement with Dr. Abbey Chatters   Active Problems:   * No active hospital problems. *   Discharged Condition: good  Hospital Course: Gabriela Biswas was admitted for surgery on 07-28-12 with Dx Pseudotumor cerebri, Vision loss. Following uncomplicated v.p. Shunt placement she recovered in Neuro PACU before transfer to NICU & 4N for observation. She has progressed well.     Consults: general surgery  Significant Diagnostic Studies:   Treatments: surgery: SHUNT INSERTION VENTRICULAR-PERITONEAL (N/A) - Ventricular-peritoneal shunt placement with Dr. Abbey Chatters LAPAROSCOPIC REVISION VENTRICULAR-PERITONEAL (V-P) SHUNT (Left) - Ventricular-peritoneal shunt placement with Dr. Abbey Chatters    Discharge Exam: Blood pressure 109/63, pulse 85, temperature 97.9 F (36.6 C), temperature source Oral, resp. rate 16, height 5\' 3"  (1.6 m), weight 121.8 kg (268 lb 8.3 oz), last menstrual period 05/24/2012, SpO2 100.00%. Alert, conversant, sitting on edge of bed. Incisions without erythema, swelling, or drainage. MAEW. PEARL. EOMI.    Disposition: 01-Home or Self Care     Medication List    ASK your doctor about these medications       acetaZOLAMIDE 250 MG tablet  Commonly known as:  DIAMOX  Take 500 mg by mouth 2 (two) times daily.         Signed: Georgiann Cocker 07/30/2012, 7:24 AM

## 2012-07-31 ENCOUNTER — Encounter (HOSPITAL_COMMUNITY): Payer: Self-pay | Admitting: Neurosurgery

## 2012-07-31 MED ORDER — MAGNESIUM CITRATE PO SOLN
1.0000 | Freq: Once | ORAL | Status: AC
  Administered 2012-07-31: 1 via ORAL
  Filled 2012-07-31: qty 296

## 2012-08-01 NOTE — Discharge Summary (Signed)
Physician Discharge Summary  Patient ID: Gabriela Cantu MRN: [id number] DOB/AGE: 09-27-1990 22 y.o.  Admit date: 07/28/2012 Discharge date: 08/01/2012  Admission Diagnoses: Pseudotumor cerebri    Discharge Diagnoses: Same   Discharged Condition: good  Hospital Course: The patient was admitted on 07/28/2012 and taken to the operating room where the patient underwent VP shunt placement. The patient tolerated the procedure well and was taken to the recovery room and then to the floor in stable condition. The hospital course was routine. There were no complications. The wound remained clean dry and intact. Pt had appropriate incisional soreness. No complaints new N/T/W. vision is improved. The patient remained afebrile with stable vital signs, and tolerated a regular diet. The patient continued to increase activities, and pain was well controlled with oral pain medications.   Consults: None  Significant Diagnostic Studies:  Results for orders placed during the hospital encounter of 07/27/12  SURGICAL PCR SCREEN      Result Value Range   MRSA, PCR NEGATIVE  NEGATIVE   Staphylococcus aureus NEGATIVE  NEGATIVE  CBC      Result Value Range   WBC 10.3  4.0 - 10.5 K/uL   RBC 4.22  3.87 - 5.11 MIL/uL   Hemoglobin 12.6  12.0 - 15.0 g/dL   HCT 40.9 (*) 81.1 - 91.4 %   MCV 82.5  78.0 - 100.0 fL   MCH 29.9  26.0 - 34.0 pg   MCHC 36.2 (*) 30.0 - 36.0 g/dL   RDW 78.2  95.6 - 21.3 %   Platelets 338  150 - 400 K/uL  HCG, SERUM, QUALITATIVE      Result Value Range   Preg, Serum NEGATIVE  NEGATIVE    Ct Head Wo Contrast  07/11/2012  *RADIOLOGY REPORT*  Clinical Data: Headache and clinical suspicion of pseudotumor cerebri with papilledema on exam.  CT HEAD WITHOUT CONTRAST  Technique:  Contiguous axial images were obtained from the base of the skull through the vertex without contrast.  Comparison: 07/07/2012  Findings: The brain demonstrates no evidence of hemorrhage, infarction, edema, mass effect,  extra-axial fluid collection, hydrocephalus or mass lesion.  The skull is unremarkable.  IMPRESSION: Stable and normal head CT.   Original Report Authenticated By: Irish Lack, M.D.    Ct Head Wo Contrast  07/07/2012  *RADIOLOGY REPORT*  Clinical Data: 22 year old female with severe headache, blurred vision and dizziness.  CT HEAD WITHOUT CONTRAST  Technique:  Contiguous axial images were obtained from the base of the skull through the vertex without contrast.  Comparison: None  Findings: No intracranial abnormalities are identified, including mass lesion or mass effect, hydrocephalus, extra-axial fluid collection, midline shift, hemorrhage, or acute infarction.  The visualized bony calvarium is unremarkable.  IMPRESSION: Unremarkable noncontrast head CT.   Original Report Authenticated By: Harmon Pier, M.D.    Mr Laqueta Jean Wo Contrast  07/16/2012  *RADIOLOGY REPORT*  Clinical Data:  22 year old female with headache and suspicion of idiopathic intracranial hypertension.  Papilledema.  Worsening vision, nausea, vomiting, headache.  Comparison:  Head CTs 07/11/2012, 07/07/2012.  Report of the fluoroscopic guided lumbar punctures 07/15/2012, 07/11/2012.  MRI HEAD WITHOUT AND WITH CONTRAST  Technique:  Multiplanar, multiecho pulse sequences of the brain and surrounding structures were obtained without and with intravenous contrast.  Contrast: 20mL MULTIHANCE GADOBENATE DIMEGLUMINE 529 MG/ML IV SOLN .  Findings:  Normal cerebral volume. No restricted diffusion to suggest acute infarction.  No midline shift, mass effect, evidence of mass lesion, ventriculomegaly, extra-axial collection or acute  intracranial hemorrhage.  Cervicomedullary junction and pituitary are within normal limits.  Major intracranial vascular flow voids are preserved, with no abnormal expansion or signal associated with the transverse or sigmoid sinuses.   There is a small solitary focus of increased T2 and FLAIR hyperintensity in the anterior  right frontal lobe white matter on series 6 image 15.  No associated enhancement.  Elsewhere normal gray and white matter signal. No abnormal enhancement identified.  Negative visualized cervical spine.  Prominence of the bilateral optic disc compatible with papilledema (series 12 image 24).  Bilateral optic nerves appear within normal limits.  Other orbit soft tissues are normal.  Visualized paranasal sinuses and mastoids are clear.  Negative scalp soft tissues.  Diffusely decreased bone marrow signal on T1- weighted images.  No abnormal marrow enhancement or focal suspicious marrow lesion identified.  IMPRESSION: 1.  Negative MRI appearance of the brain except for a solitary nonspecific focus of right frontal lobe white matter signal change. 2.  Prominence of the optic discs compatible with papilledema.  Otherwise negative orbit soft tissues. 3.  See MRV findings below.  MRV HEAD WITHOUT CONTRAST  Technique:  Angiographic images of the intracranial venous structures were obtained using MRV technique without intravenous contrast.  Findings:  Normal flow signal in the superior sagittal sinus, torcula, straight sinus, vein of Galen, internal cerebral veins and basal vein of Rosenthal.  Dominant transverse sinus drainage to the right.  Both transverse and sigmoid sinuses appear to remain patent, but there is bilateral moderate to severe stenosis along lateral margins of the transverse sinuses, near the junction with the sigmoid sinuses).  Both internal jugular bulbs and visualized internal jugular veins show preserved flow signal, and the right is dominant.  Major draining cortical veins appear to be patent, including the bilateral Trolard and Labbe veins.  IMPRESSION: No evidence of dural venous thrombosis, but there is moderate to severe stenosis at of both transverse sinuses, secondary phenomenon commonly observed in idiopathic intracranial hypertension.   Original Report Authenticated By: Erskine Speed, M.D.    Mr  Mrv Head Wo Cm  07/16/2012  *RADIOLOGY REPORT*  Clinical Data:  22 year old female with headache and suspicion of idiopathic intracranial hypertension.  Papilledema.  Worsening vision, nausea, vomiting, headache.  Comparison:  Head CTs 07/11/2012, 07/07/2012.  Report of the fluoroscopic guided lumbar punctures 07/15/2012, 07/11/2012.  MRI HEAD WITHOUT AND WITH CONTRAST  Technique:  Multiplanar, multiecho pulse sequences of the brain and surrounding structures were obtained without and with intravenous contrast.  Contrast: 20mL MULTIHANCE GADOBENATE DIMEGLUMINE 529 MG/ML IV SOLN .  Findings:  Normal cerebral volume. No restricted diffusion to suggest acute infarction.  No midline shift, mass effect, evidence of mass lesion, ventriculomegaly, extra-axial collection or acute intracranial hemorrhage.  Cervicomedullary junction and pituitary are within normal limits.  Major intracranial vascular flow voids are preserved, with no abnormal expansion or signal associated with the transverse or sigmoid sinuses.   There is a small solitary focus of increased T2 and FLAIR hyperintensity in the anterior right frontal lobe white matter on series 6 image 15.  No associated enhancement.  Elsewhere normal gray and white matter signal. No abnormal enhancement identified.  Negative visualized cervical spine.  Prominence of the bilateral optic disc compatible with papilledema (series 12 image 24).  Bilateral optic nerves appear within normal limits.  Other orbit soft tissues are normal.  Visualized paranasal sinuses and mastoids are clear.  Negative scalp soft tissues.  Diffusely decreased bone marrow signal on T1-  weighted images.  No abnormal marrow enhancement or focal suspicious marrow lesion identified.  IMPRESSION: 1.  Negative MRI appearance of the brain except for a solitary nonspecific focus of right frontal lobe white matter signal change. 2.  Prominence of the optic discs compatible with papilledema.  Otherwise negative  orbit soft tissues. 3.  See MRV findings below.  MRV HEAD WITHOUT CONTRAST  Technique:  Angiographic images of the intracranial venous structures were obtained using MRV technique without intravenous contrast.  Findings:  Normal flow signal in the superior sagittal sinus, torcula, straight sinus, vein of Galen, internal cerebral veins and basal vein of Rosenthal.  Dominant transverse sinus drainage to the right.  Both transverse and sigmoid sinuses appear to remain patent, but there is bilateral moderate to severe stenosis along lateral margins of the transverse sinuses, near the junction with the sigmoid sinuses).  Both internal jugular bulbs and visualized internal jugular veins show preserved flow signal, and the right is dominant.  Major draining cortical veins appear to be patent, including the bilateral Trolard and Labbe veins.  IMPRESSION: No evidence of dural venous thrombosis, but there is moderate to severe stenosis at of both transverse sinuses, secondary phenomenon commonly observed in idiopathic intracranial hypertension.   Original Report Authenticated By: Erskine Speed, M.D.    Dg Lumbar Puncture Fluoro Guide  07/16/2012  *RADIOLOGY REPORT*  Clinical Data:  22 year old female with headache, vision change and vomiting.  History of pseudotumor cerebri.  DIAGNOSTIC LUMBAR PUNCTURE UNDER FLUOROSCOPIC GUIDANCE  Fluoroscopy time:  0.1 minutes.  Technique:  Informed consent was obtained from the patient prior to the procedure, including potential complications of headache, allergy, and pain.  With the patient prone, the lower back was prepped with Betadine. 1% Lidocaine was used for local anesthesia. Lumbar puncture was performed at the L3-L4 level using a 20 gauge needle with return of clear colorless CSF with an opening pressure of 61 cm water.   27 ml of CSF were obtained for laboratory studies. Closing pressure was less than 18 cm water. The patient tolerated the procedure well and there were no  apparent complications.  IMPRESSION: Fluoroscopic guided lumbar puncture with elevated opening pressure. 27 ml of CSF was obtained. No immediate complication.   Original Report Authenticated By: Harmon Pier, M.D.    Dg Fluoro Guide Lumbar Puncture  07/11/2012  *RADIOLOGY REPORT*  Clinical Data:  Headache, papilledema and visual loss.  DIAGNOSTIC LUMBAR PUNCTURE UNDER FLUOROSCOPIC GUIDANCE  Fluoroscopy time:  46 seconds.  Technique:  Informed consent was obtained from the patient prior to the procedure, including potential complications of headache, allergy, and pain.   With the patient prone, the lower back was prepped with Betadine.  1% Lidocaine was used for local anesthesia.  Lumbar puncture was performed at the L2-3 level from a right interlaminar approach using a 20 gauge gauge needle with return of initially blood tinged CSF with an opening pressure of 40 cm water. 15 ml of CSF were obtained for laboratory studies.  Including CSF used for pressure measurement and waste, a total of approximately 20 ml of CSF was removed in total.  CSF did clear during collection.  Closing pressure was 14 cm water.  The patient tolerated the procedure well and there were no apparent complications.  IMPRESSION: Lumbar puncture performed under fluoroscopy.  Estimated pressure was reduced from 40 cm water to 14 cm water after CSF collection.   Original Report Authenticated By: Irish Lack, M.D.     Antibiotics:  Anti-infectives  Start     Dose/Rate Route Frequency Ordered Stop   07/28/12 1830  ceFAZolin (ANCEF) IVPB 2 g/50 mL premix     2 g 100 mL/hr over 30 Minutes Intravenous Every 8 hours 07/28/12 1710 07/29/12 0233   07/28/12 0600  ceFAZolin (ANCEF) IVPB 2 g/50 mL premix     2 g 100 mL/hr over 30 Minutes Intravenous On call to O.R. 07/27/12 1429 07/28/12 1440      Discharge Exam: Blood pressure 125/83, pulse 87, temperature 98.8 F (37.1 C), temperature source Oral, resp. rate 18, height 5\' 3"  (1.6 m),  weight 121.8 kg (268 lb 8.3 oz), last menstrual period 05/24/2012, SpO2 100.00%. Neurologic: Grossly normal Incision okay  Discharge Medications:     Medication List    TAKE these medications       acetaZOLAMIDE 250 MG tablet  Commonly known as:  DIAMOX  Take 500 mg by mouth 2 (two) times daily.        Disposition: Home   Final Dx: VP shunt      Discharge Orders   Future Orders Complete By Expires     Call MD for:  difficulty breathing, headache or visual disturbances  As directed     Call MD for:  persistant dizziness or light-headedness  As directed     Call MD for:  persistant nausea and vomiting  As directed     Call MD for:  redness, tenderness, or signs of infection (pain, swelling, redness, odor or green/yellow discharge around incision site)  As directed     Call MD for:  severe uncontrolled pain  As directed     Call MD for:  temperature >100.4  As directed     Diet - low sodium heart healthy  As directed     Discharge instructions  As directed     Comments:      No strenuous activity. No driving.    Increase activity slowly  As directed        Follow-up Information   Follow up with STERN,JOSEPH D, MD. Schedule an appointment as soon as possible for a visit in 2 weeks.   Contact information:   [address] [place] [address] [phone]        Signed: Tia Alert 08/01/2012, 7:32 AM

## 2012-08-01 NOTE — Progress Notes (Signed)
Given all D/C education and clarify her questions. Pt received her prescription for analgesics. Ready to go home. No concerns.  Danne Harbor 08/01/13

## 2012-08-05 NOTE — Progress Notes (Signed)
Doing well.  D/C home later today.

## 2012-08-05 NOTE — Discharge Summary (Signed)
Doing well after VP shunt placement.  Will need to follow up with Ophthalmology in near future and in our office in 10 days for suture removal.

## 2014-07-04 ENCOUNTER — Encounter (HOSPITAL_BASED_OUTPATIENT_CLINIC_OR_DEPARTMENT_OTHER): Payer: Medicaid Other

## 2014-09-09 ENCOUNTER — Encounter (HOSPITAL_BASED_OUTPATIENT_CLINIC_OR_DEPARTMENT_OTHER): Payer: Medicaid Other

## 2015-06-04 ENCOUNTER — Emergency Department (HOSPITAL_COMMUNITY)
Admission: EM | Admit: 2015-06-04 | Discharge: 2015-06-04 | Disposition: A | Discharge status: Home / Self Care | Payer: Medicaid Other | Attending: Emergency Medicine | Admitting: Emergency Medicine

## 2015-06-04 ENCOUNTER — Encounter (HOSPITAL_COMMUNITY): Payer: Self-pay | Admitting: Emergency Medicine

## 2015-06-04 DIAGNOSIS — Z3202 Encounter for pregnancy test, result negative: Secondary | ICD-10-CM | POA: Diagnosis not present

## 2015-06-04 DIAGNOSIS — R42 Dizziness and giddiness: Secondary | ICD-10-CM | POA: Insufficient documentation

## 2015-06-04 DIAGNOSIS — Z79899 Other long term (current) drug therapy: Secondary | ICD-10-CM | POA: Diagnosis not present

## 2015-06-04 DIAGNOSIS — R5381 Other malaise: Secondary | ICD-10-CM | POA: Diagnosis not present

## 2015-06-04 LAB — COMPREHENSIVE METABOLIC PANEL
ALBUMIN: 3.2 g/dL — AB (ref 3.5–5.0)
ALK PHOS: 66 U/L (ref 38–126)
ALT: 29 U/L (ref 14–54)
AST: 23 U/L (ref 15–41)
Anion gap: 8 (ref 5–15)
BILIRUBIN TOTAL: 0.4 mg/dL (ref 0.3–1.2)
BUN: 10 mg/dL (ref 6–20)
CALCIUM: 9.3 mg/dL (ref 8.9–10.3)
CO2: 25 mmol/L (ref 22–32)
CREATININE: 0.88 mg/dL (ref 0.44–1.00)
Chloride: 107 mmol/L (ref 101–111)
GFR calc Af Amer: >60 mL/min (ref >60)
GFR calc non Af Amer: >60 mL/min (ref >60)
GLUCOSE: 140 mg/dL — AB (ref 65–99)
Potassium: 4.1 mmol/L (ref 3.5–5.1)
SODIUM: 140 mmol/L (ref 135–145)
TOTAL PROTEIN: 6.7 g/dL (ref 6.5–8.1)

## 2015-06-04 LAB — I-STAT BETA HCG BLOOD, ED (MC, WL, AP ONLY)

## 2015-06-04 LAB — CBC
HCT: 35.6 % — ABNORMAL LOW (ref 36.0–46.0)
Hemoglobin: 12.3 g/dL (ref 12.0–15.0)
MCH: 29 pg (ref 26.0–34.0)
MCHC: 34.6 g/dL (ref 30.0–36.0)
MCV: 84 fL (ref 78.0–100.0)
PLATELETS: 336 10*3/uL (ref 150–400)
RBC: 4.24 MIL/uL (ref 3.87–5.11)
RDW: 13.2 % (ref 11.5–15.5)
WBC: 9.7 10*3/uL (ref 4.0–10.5)

## 2015-06-04 LAB — URINALYSIS, ROUTINE W REFLEX MICROSCOPIC
BILIRUBIN URINE: NEGATIVE
Glucose, UA: NEGATIVE mg/dL
KETONES UR: NEGATIVE mg/dL
NITRITE: NEGATIVE
PROTEIN: 100 mg/dL — AB
Specific Gravity, Urine: 1.013 (ref 1.005–1.030)
pH: 8 (ref 5.0–8.0)

## 2015-06-04 LAB — POC URINE PREG, ED: Preg Test, Ur: NEGATIVE

## 2015-06-04 LAB — LIPASE, BLOOD: Lipase: 25 U/L (ref 11–51)

## 2015-06-04 LAB — URINE MICROSCOPIC-ADD ON

## 2015-06-04 NOTE — ED Notes (Signed)
Pt c/o 2 days ago feeling like she was going to pass out. Pt checked her BP today and it was elevated, 137/86 HR 105.

## 2015-06-04 NOTE — ED Notes (Signed)
Westley GamblesJosh G., PA at bedside.

## 2015-06-04 NOTE — ED Provider Notes (Signed)
CSN: [id number]     Arrival date & time 06/04/15  1203 History   First MD Initiated Contact with Patient 06/04/15 1218     Chief Complaint  Patient presents with  . Hypertension     (Consider location/radiation/quality/duration/timing/severity/associated sxs/prior Treatment) HPI Comments: Patient with history of pseudotumor cerebri status post VP shunt placed in 2014 -- presents with episodes of lightheadedness without full syncope intermittently over the past 3 days. Patient states that she just has not felt well. She has not had any chest pain or shortness of breath. After an episode this morning, patient called her sister who is a doctor and was told to check her blood pressure. This was elevated to 137/86. Patient presents mainly for this. With previous pseudotumor cerebri, patient had severe headaches with vision change and difficulty walking. She has had none of the symptoms return. No recent fevers, URI symptoms, chest pain, abdominal pain, urinary symptoms, skin rash. No family history of sudden cardiac death. No symptoms on exertion. The onset of this condition was acute. The course is constant. Aggravating factors: none. Alleviating factors: none.    The history is provided by the patient and medical records.    History reviewed. No pertinent past medical history. Past Surgical History  Procedure Laterality Date  . No surgical history  Jan 2014  . Ventriculoperitoneal shunt N/A 07/28/2012    Procedure: SHUNT INSERTION VENTRICULAR-PERITONEAL;  Surgeon: Maeola HarmanJoseph Stern, MD;  Location: [facility];  Service: Neurosurgery;  Laterality: N/A;  Ventricular-peritoneal shunt placement with Dr. Abbey Chattersosenbower  . Laparoscopic revision ventricular-peritoneal (v-p) shunt Left 07/28/2012    Procedure: LAPAROSCOPIC REVISION VENTRICULAR-PERITONEAL (V-P) SHUNT;  Surgeon: Adolph Pollackodd J Rosenbower, MD;  Location: [facility];  Service: General;  Laterality: Left;  Ventricular-peritoneal shunt placement with Dr.  Abbey Chattersosenbower   No family history on file. Social History  Substance Use Topics  . Smoking status: Never Smoker   . Smokeless tobacco: Never Used  . Alcohol Use: No   OB History    No data available     Review of Systems  Constitutional: Negative for fever.  HENT: Negative for rhinorrhea and sore throat.   Eyes: Negative for redness.  Respiratory: Negative for cough.   Cardiovascular: Negative for chest pain.  Gastrointestinal: Negative for nausea, vomiting, abdominal pain, diarrhea and blood in stool.  Genitourinary: Positive for vaginal bleeding (on period). Negative for dysuria.  Musculoskeletal: Negative for myalgias.  Skin: Negative for rash.  Neurological: Positive for light-headedness. Negative for syncope and headaches.      Allergies  Review of patient's allergies indicates no known allergies.  Home Medications   Prior to Admission medications   Medication Sig Start Date End Date Taking? Authorizing Provider  acetaZOLAMIDE (DIAMOX) 250 MG tablet Take 500 mg by mouth 2 (two) times daily.    Historical Provider, MD   BP 145/86 mmHg  Pulse 105  Temp(Src) 98.5 F (36.9 C) (Oral)  Resp 16  Ht 5\' 3"  (1.6 m)  SpO2 100%  LMP 06/04/2015   Physical Exam  Constitutional: She is oriented to person, place, and time. She appears well-developed and well-nourished.  HENT:  Head: Normocephalic and atraumatic.  Right Ear: Tympanic membrane, external ear and ear canal normal.  Left Ear: Tympanic membrane, external ear and ear canal normal.  Nose: Nose normal.  Mouth/Throat: Uvula is midline, oropharynx is clear and moist and mucous membranes are normal.  Eyes: Conjunctivae, EOM and lids are normal. Pupils are equal, round, and reactive to light. Right eye  exhibits no discharge. Left eye exhibits no discharge. Right eye exhibits no nystagmus. Left eye exhibits no nystagmus.  Neck: Normal range of motion. Neck supple.  Cardiovascular: Normal rate, regular rhythm and normal  heart sounds.   Pulmonary/Chest: Effort normal and breath sounds normal.  Abdominal: Soft. There is no tenderness.  Musculoskeletal:       Cervical back: She exhibits normal range of motion, no tenderness and no bony tenderness.  No pain along course of shunt tubing in neck.   Neurological: She is alert and oriented to person, place, and time. She has normal strength and normal reflexes. No cranial nerve deficit or sensory deficit. She displays a negative Romberg sign. Coordination and gait normal. GCS eye subscore is 4. GCS verbal subscore is 5. GCS motor subscore is 6.  Skin: Skin is warm and dry.  Psychiatric: She has a normal mood and affect.  Nursing note and vitals reviewed.   ED Course  Procedures (including critical care time) Labs Review Labs Reviewed  COMPREHENSIVE METABOLIC PANEL - Abnormal; Notable for the following:    Glucose, Bld 140 (*)    Albumin 3.2 (*)    All other components within normal limits  CBC - Abnormal; Notable for the following:    HCT 35.6 (*)    All other components within normal limits  URINALYSIS, ROUTINE W REFLEX MICROSCOPIC (NOT AT [facility]) - Abnormal; Notable for the following:    Color, Urine RED (*)    APPearance CLOUDY (*)    Hgb urine dipstick LARGE (*)    Protein, ur 100 (*)    Leukocytes, UA TRACE (*)    All other components within normal limits  URINE MICROSCOPIC-ADD ON - Abnormal; Notable for the following:    Squamous Epithelial / LPF 0-5 (*)    Bacteria, UA RARE (*)    All other components within normal limits  LIPASE, BLOOD  I-STAT BETA HCG BLOOD, ED ([facility], [facility], [facility] ONLY)  POC URINE PREG, ED    Imaging Review No results found. I have personally reviewed and evaluated these images and lab results as part of my medical decision-making.   EKG Interpretation   Date/Time:  Sunday June 04 2015 13:15:19 EST Ventricular Rate:  109 PR Interval:  147 QRS Duration: 81 QT Interval:  305 QTC Calculation: 411 R Axis:   37 Text  Interpretation:  Sinus tachycardia No old tracing to compare  Confirmed by CAMPOS  MD, Caryn Bee ([id number]) on 06/04/2015 1:34:04 PM       1:34 PM Patient seen and examined. Work-up initiated. Medications ordered.   Vital signs reviewed and are as follows: BP 145/86 mmHg  Pulse 100  Temp(Src) 98.5 F (36.9 C) (Oral)  Resp 14  Ht  (1.6 m)  SpO2 98%  LMP 06/04/2015  2:45 PM Work-up reassuring. EKG without prolonged QT, Brugada, signs of WPW.  Workup ordered by protocol prior to my exam. Patient informed of results. Significant for hematuria, likely contaminate. Also mildly elevated blood sugar. Blood pressure is only mildly elevated here.  PT discussed with and seen by Dr. Patria Mane. Feel safe for d/c to home at this time with PCP f/u.   Patient counseled to return with worsening symptoms, full syncope, shortness of breath, chest pain, or any other concerns. She verbalizes understanding and agrees to plan. Encouraged PCP follow-up for recheck of blood pressure.  MDM   Final diagnoses:  Episodic lightheadedness   Patient with generalized malaise, episodes of lightheadedness without full syncope over the  past several days. No chest pain or shortness of breath. She is mildly tachycardic but is anxious. No other clinical signs suggestive of PE. Hemoglobin is normal. No signs of urinary tract infection. Electrolytes are normal. EKG is reassuring. She is not orthostatic. Conservative measures with PCP follow-up indicated at this point. Return instructions as above.  Renne Crigler, PA-C 06/04/15 1447  Azalia Bilis, MD 06/04/15 [phone]

## 2015-06-04 NOTE — Discharge Instructions (Signed)
Please read and follow all provided instructions.  Your diagnoses today include:  1. Episodic lightheadedness     Tests performed today include:  Blood counts and electrolytes - normal  EKG - normal  Urine test - normal  Vital signs. See below for your results today.   Medications prescribed:   None  Take any prescribed medications only as directed.  Home care instructions:  Follow any educational materials contained in this packet.  BE VERY CAREFUL not to take multiple medicines containing Tylenol (also called acetaminophen). Doing so can lead to an overdose which can damage your liver and cause liver failure and possibly death.   Follow-up instructions: Please follow-up with your primary care provider in the next 3 days for further evaluation of your symptoms.   Return instructions:   Please return to the Emergency Department if you experience worsening symptoms.   Return with chest pain, shortness of breath, if you pass out completely.  Please return if you have any other emergent concerns.  Additional Information:  Your vital signs today were: BP 135/67 mmHg   Pulse 108   Temp(Src) 98.5 F (36.9 C) (Oral)   Resp 14   Ht 5\' 3"  (1.6 m)   SpO2 99%   LMP 06/04/2015 If your blood pressure (BP) was elevated above 135/85 this visit, please have this repeated by your doctor within one month. --------------

## 2015-06-06 ENCOUNTER — Ambulatory Visit
Admission: RE | Admit: 2015-06-06 | Discharge: 2015-06-06 | Disposition: A | Discharge status: Home / Self Care | Payer: Medicaid Other | Source: Ambulatory Visit | Attending: Neurology | Admitting: Neurology

## 2015-06-06 ENCOUNTER — Ambulatory Visit (INDEPENDENT_AMBULATORY_CARE_PROVIDER_SITE_OTHER): Payer: Medicaid Other | Admitting: Neurology

## 2015-06-06 ENCOUNTER — Encounter: Payer: Self-pay | Admitting: Neurology

## 2015-06-06 VITALS — BP 130/90 | HR 110 | Ht 63.0 in | Wt 315.0 lb

## 2015-06-06 DIAGNOSIS — Z982 Presence of cerebrospinal fluid drainage device: Secondary | ICD-10-CM

## 2015-06-06 DIAGNOSIS — R55 Syncope and collapse: Secondary | ICD-10-CM

## 2015-06-06 NOTE — Progress Notes (Signed)
Gabriela Cantu was seen today after not having seen her in almost 3 years.  After I last saw her, the patient did have a VP shunt placed for pseudotumor cerebri.  She did well following that. She has not followed up with Dr. Venetia MaxonStern since 10/2012 (I called his office for notes and those were the last received and reviewed). 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 Charlotte SanesMcCuen and last f/u there in July.   She then went to the emergency room on 06/04/2015 complaining of lightheadedness and near syncope. She states that she felt that way for several days so she asked her uncle to take her BP and it was 137/86 and Pulse was 105.  Her sister [place] doctor [place] and told her to go to the ER.   Her blood pressure was only mildly elevated at 145/86.   She states that she is feeling better.   Her lab work was fairly unrevealing with the exception of a large amount of blood on her urinalysis (on menses) and trace leukocytes.  Vision has been good.  No headaches.  She does have a PCP but hasn't gone to him in a few months as she doesn't have a good rapport with him and wants a new PCP.  In [place], she will have private insurance and will be looking for another and asks me to facilitate.     ALLERGIES:  No Known Allergies  CURRENT MEDICATIONS:  No outpatient encounter prescriptions on file as of 06/06/2015.   No facility-administered encounter medications on file as of 06/06/2015.    PAST MEDICAL HISTORY:  No past medical history on file.  PAST SURGICAL HISTORY:   Past Surgical History  Procedure Laterality Date  . Ventriculoperitoneal shunt N/A 07/28/2012    Procedure: SHUNT INSERTION VENTRICULAR-PERITONEAL;  Surgeon: Maeola HarmanJoseph Stern, MD;  Location: [facility];  Service: Neurosurgery;  Laterality: N/A;  Ventricular-peritoneal shunt placement with Dr. Abbey Chattersosenbower  . Laparoscopic revision ventricular-peritoneal (v-p) shunt Left 07/28/2012    Procedure:  LAPAROSCOPIC REVISION VENTRICULAR-PERITONEAL (V-P) SHUNT;  Surgeon: Adolph Pollackodd J Rosenbower, MD;  Location: [facility];  Service: General;  Laterality: Left;  Ventricular-peritoneal shunt placement with Dr. Abbey Chattersosenbower    SOCIAL HISTORY:   Social History   Social History  . Marital Status: Single    Spouse Name: N/A  . Number of Children: N/A  . Years of Education: N/A   Occupational History  . housekeeping    Social History Main Topics  . Smoking status: Never Smoker   . Smokeless tobacco: Never Used  . Alcohol Use: No  . Drug Use: No  . Sexual Activity: Not on file   Other Topics Concern  . Not on file   Social History Narrative    FAMILY HISTORY:   Family Status  Relation Status Death Age  . Mother Alive     congential renal disease, HTN  . Father Alive     HTN  . Sister Alive     3, alive and well    ROS:  A complete 10 system review of systems was obtained and was unremarkable apart from what is mentioned above.  PHYSICAL EXAMINATION:    VITALS:   Filed Vitals:   06/06/15 1512  BP: 130/90  Pulse: 110  Height: 5\' 3"  (1.6 m)  Weight: 315 lb (142.883 kg)   Wt Readings from Last 3 Encounters:  06/06/15 315 lb (142.883  kg)  07/28/12 268 lb 8.3 oz (121.8 kg)  07/27/12 259 lb 0.7 oz (117.5 kg)     GEN:  Normal appears female in no acute distress.  Appears stated age. HEENT:  Normocephalic, atraumatic. The mucous membranes are moist. The superficial temporal arteries are without ropiness or tenderness. Cardiovascular: Regular rate and rhythm. Lungs: Clear to auscultation bilaterally. Neck/Heme: There are no carotid bruits noted bilaterally.  NEUROLOGICAL: Orientation:  The patient is alert and oriented x 3.   Cranial nerves: There is good facial symmetry. The pupils are equal round and reactive to light bilaterally. Fundoscopic exam is attempted but the disc margins are not well visualized bilaterally.. Extraocular muscles are intact and visual fields are  full to confrontational testing. Speech is fluent and clear. Soft palate rises symmetrically and there is no tongue deviation. Hearing is intact to conversational tone. Tone: Tone is good throughout. Sensation: Sensation is intact to light touch Coordination:  The patient has no difficulty with RAM's or FNF bilaterally. Motor: Strength is 5/5 in the bilateral upper and lower extremities.  Shoulder shrug is equal and symmetric. There is no pronator drift.  There are no fasciculations noted. Gait and Station: The patient is able to ambulate without difficulty.   IMPRESSION/PLAN  1. Near syncope  -has nearly resolved.  Pt thinks related to BP. BP is certainly up for a person her age, but not sure that would be symptommatic.  Regardless, I think that she needs a PCP to look at this.  She asks for a referral and she will be getting new insurance at the beginning of the year.  She was given numbers to the [place] offices and she can call and make an appointment. 2.  History of significant pseudotumor cerebri requiring VP shunt  -We will do a CT of the brain today.  We will call her with the results.  -She is following with [facility] ophthalmology regarding visual field deficits. 3.  Morbid obesity  -Unfortunately, she has gained a significant amount of weight over the last few years since I have seen her.  She reports that she tried "diet pills" given by her primary care physician.  Talked about proper diet and exercise.

## 2015-06-06 NOTE — Patient Instructions (Signed)
[facility] - [phone]  [facility] - [phone]  [facility] - [phone]

## 2015-06-07 ENCOUNTER — Telehealth: Payer: Self-pay | Admitting: Neurology

## 2015-06-07 NOTE — Telephone Encounter (Signed)
LMOM making patient aware scan okay.

## 2015-06-07 NOTE — Telephone Encounter (Signed)
-----   Message from Octaviano Battyebecca S Tat, DO sent at 06/07/2015  7:43 AM EST ----- Please let pt know that CT looked ok

## 2015-08-22 ENCOUNTER — Ambulatory Visit (INDEPENDENT_AMBULATORY_CARE_PROVIDER_SITE_OTHER): Payer: BLUE CROSS/BLUE SHIELD | Admitting: Emergency Medicine

## 2015-08-22 VITALS — BP 124/88 | HR 101 | Temp 98.8°F | Resp 18 | Ht 63.0 in | Wt 321.0 lb

## 2015-08-22 DIAGNOSIS — R739 Hyperglycemia, unspecified: Secondary | ICD-10-CM | POA: Diagnosis not present

## 2015-08-22 DIAGNOSIS — R0683 Snoring: Secondary | ICD-10-CM

## 2015-08-22 DIAGNOSIS — N926 Irregular menstruation, unspecified: Secondary | ICD-10-CM | POA: Diagnosis not present

## 2015-08-22 DIAGNOSIS — N841 Polyp of cervix uteri: Secondary | ICD-10-CM | POA: Diagnosis not present

## 2015-08-22 DIAGNOSIS — R809 Proteinuria, unspecified: Secondary | ICD-10-CM

## 2015-08-22 DIAGNOSIS — Z0000 Encounter for general adult medical examination without abnormal findings: Secondary | ICD-10-CM

## 2015-08-22 LAB — POCT CBC
GRANULOCYTE PERCENT: 60.7 % (ref 37–80)
HEMATOCRIT: 34.2 % — AB (ref 37.7–47.9)
HEMOGLOBIN: 12 g/dL — AB (ref 12.2–16.2)
Lymph, poc: 4.1 — AB (ref 0.6–3.4)
MCH: 28.8 pg (ref 27–31.2)
MCHC: 35 g/dL (ref 31.8–35.4)
MCV: 82.3 fL (ref 80–97)
MID (cbc): 0.7 (ref 0–0.9)
MPV: 7.8 fL (ref 0–99.8)
POC GRANULOCYTE: 7.4 — AB (ref 2–6.9)
POC LYMPH PERCENT: 33.5 %L (ref 10–50)
POC MID %: 5.8 % (ref 0–12)
Platelet Count, POC: 364 10*3/uL (ref 142–424)
RBC: 4.15 M/uL (ref 4.04–5.48)
RDW, POC: 13.3 %
WBC: 12.2 10*3/uL — AB (ref 4.6–10.2)

## 2015-08-22 LAB — POCT URINALYSIS DIP (MANUAL ENTRY)
BILIRUBIN UA: NEGATIVE
GLUCOSE UA: NEGATIVE
Ketones, POC UA: NEGATIVE
Leukocytes, UA: NEGATIVE
NITRITE UA: NEGATIVE
Protein Ur, POC: >=300 — AB
Spec Grav, UA: 1.02
Urobilinogen, UA: 0.2
pH, UA: 5.5

## 2015-08-22 LAB — POCT URINE PREGNANCY: PREG TEST UR: NEGATIVE

## 2015-08-22 LAB — GLUCOSE, POCT (MANUAL RESULT ENTRY): POC Glucose: 111 mg/dl — AB (ref 70–99)

## 2015-08-22 LAB — POCT GLYCOSYLATED HEMOGLOBIN (HGB A1C): Hemoglobin A1C: 6.1

## 2015-08-22 LAB — TSH: TSH: 1.1 mIU/L

## 2015-08-22 NOTE — Patient Instructions (Addendum)
Because you received labwork today, you will receive an invoice from [facility]. Please contact [facility] at [phone] with questions or concerns regarding your invoice. Our billing staff will not be able to assist you with those questions.  You will be contacted with the lab results as soon as they are available. The fastest way to get your results is to activate your My Chart account. Instructions are located on the last page of this paperwork. If you have not heard from us regarding the results in 2 weeks, please contact this office. You are going to be scheduled for a sleep study. You have been referred to a GYN doctor. I have ordered a 24-hour collection of your urine. You are prediabetic and must work on diet and weight loss.

## 2015-08-22 NOTE — Progress Notes (Addendum)
Patient ID: Gabriela Cantu, female   DOB: 07/20/1990, 25 y.o.   MRN: [id number]     By signing my name below, I, Littie Deedsichard Sun, attest that this documentation has been prepared under the direction and in the presence of Lesle ChrisSteven Kennedie Pardoe, MD.  Electronically Signed: Littie Deedsichard Sun, Medical Scribe. 08/22/2015. 1:47 PM.   Chief Complaint:  Chief Complaint  Patient presents with  . Employment Physical  . Menstrual Problem    on period 3-4 weeks at a time    HPI: Gabriela Borah is a 25 y.o. female s/p ventriculoperitoneal shunt who [facility] for a physical exam for her job. She has not had a sleep study done yet. She had the flu shot in August 2016. Patient has considered gastric bypass surgery. Her parents are not overweight. She does not take any regular medications.  Patient is complaining of abnormal menstrual period this past month, noting that her menstrual period lasted about 3.5 weeks. She had been spotting intermittently during that time. She notes that she did not have heavy menses. She has had abnormal menstrual periods in the past. Patient is not currently sexually active; she last had sexual intercourse about 1 year ago. She does not have any children.  Patient works at [employer].  History reviewed. No pertinent past medical history. Past Surgical History  Procedure Laterality Date  . Ventriculoperitoneal shunt N/A 07/28/2012    Procedure: SHUNT INSERTION VENTRICULAR-PERITONEAL;  Surgeon: Maeola HarmanJoseph Stern, MD;  Location: [facility];  Service: Neurosurgery;  Laterality: N/A;  Ventricular-peritoneal shunt placement with Dr. Abbey Chattersosenbower  . Laparoscopic revision ventricular-peritoneal (v-p) shunt Left 07/28/2012    Procedure: LAPAROSCOPIC REVISION VENTRICULAR-PERITONEAL (V-P) SHUNT;  Surgeon: Adolph Pollackodd J Rosenbower, MD;  Location: [facility];  Service: General;  Laterality: Left;  Ventricular-peritoneal shunt placement with Dr. Abbey Chattersosenbower   Social History   Social History  . Marital Status: Single     Spouse Name: N/A  . Number of Children: N/A  . Years of Education: N/A   Occupational History  . housekeeping    Social History Main Topics  . Smoking status: Never Smoker   . Smokeless tobacco: Never Used  . Alcohol Use: No  . Drug Use: No  . Sexual Activity: Not Asked   Other Topics Concern  . None   Social History Narrative   History reviewed. No pertinent family history. No Known Allergies Prior to Admission medications   Not on File     ROS: The patient denies fevers, chills, night sweats, unintentional weight loss, chest pain, palpitations, wheezing, dyspnea on exertion, nausea, vomiting, abdominal pain, dysuria, hematuria, melena, numbness, weakness, or tingling.   All other systems have been reviewed and were otherwise negative with the exception of those mentioned in the HPI and as above.    PHYSICAL EXAM: Filed Vitals:   08/22/15 1301  BP: 124/88  Pulse: 101  Temp: 98.8 F (37.1 C)  Resp: 18   Body mass index is 56.88 kg/(m^2).   General: Alert, no acute distress. Morbidly obese female. HEENT:  Normocephalic, atraumatic, oropharynx patent. Shunt present. [facility] Cardiovascular:  Regular rate and rhythm, no rubs murmurs or gallops.  No Carotid bruits, radial pulse intact. No pedal edema.  Respiratory: Clear to auscultation bilaterally.  No wheezes, rales, or rhonchi.  No cyanosis, no use of accessory musculature Abdominal: Obese. No organomegaly, abdomen is soft and non-tender, positive bowel sounds.  No masses. Musculoskeletal: Gait intact. No edema, tenderness Skin: No rashes. Neurologic: Facial musculature symmetric. Psychiatric:  Patient acts appropriately throughout our interaction. Lymphatic: No cervical or submandibular lymphadenopathy Genitourinary: Breast no masses.   LABS: Results for orders placed or performed in visit on 08/22/15  POCT urinalysis dipstick  Result Value Ref Range   Color, UA yellow yellow   Clarity, UA  hazy (A) clear   Glucose, UA negative negative   Bilirubin, UA negative negative   Ketones, POC UA negative negative   Spec Grav, UA 1.020    Blood, UA large (A) negative   pH, UA 5.5    Protein Ur, POC >=300 (A) negative   Urobilinogen, UA 0.2    Nitrite, UA Negative Negative   Leukocytes, UA Negative Negative  POCT urine pregnancy  Result Value Ref Range   Preg Test, Ur Negative Negative   Results for orders placed or performed in visit on 08/22/15  POCT CBC  Result Value Ref Range   WBC 12.2 (A) 4.6 - 10.2 K/uL   Lymph, poc 4.1 (A) 0.6 - 3.4   POC LYMPH PERCENT 33.5 10 - 50 %L   MID (cbc) 0.7 0 - 0.9   POC MID % 5.8 0 - 12 %M   POC Granulocyte 7.4 (A) 2 - 6.9   Granulocyte percent 60.7 37 - 80 %G   RBC 4.15 4.04 - 5.48 M/uL   Hemoglobin 12.0 (A) 12.2 - 16.2 g/dL   HCT, POC 16.1 (A) 09.6 - 47.9 %   MCV 82.3 80 - 97 fL   MCH, POC 28.8 27 - 31.2 pg   MCHC 35.0 31.8 - 35.4 g/dL   RDW, POC 04.5 %   Platelet Count, POC 364 142 - 424 K/uL   MPV 7.8 0 - 99.8 fL  POCT glucose (manual entry)  Result Value Ref Range   POC Glucose 111 (A) 70 - 99 mg/dl  POCT glycosylated hemoglobin (Hb A1C)  Result Value Ref Range   Hemoglobin A1C 6.1   POCT urinalysis dipstick  Result Value Ref Range   Color, UA yellow yellow   Clarity, UA hazy (A) clear   Glucose, UA negative negative   Bilirubin, UA negative negative   Ketones, POC UA negative negative   Spec Grav, UA 1.020    Blood, UA large (A) negative   pH, UA 5.5    Protein Ur, POC >=300 (A) negative   Urobilinogen, UA 0.2    Nitrite, UA Negative Negative   Leukocytes, UA Negative Negative  POCT urine pregnancy  Result Value Ref Range   Preg Test, Ur Negative Negative    EKG/XRAY:   Primary read interpreted by Dr. Cleta Alberts at [facility].   ASSESSMENT/PLAN: Patient is morbidly obese and should consider bypass surgery. She had significant proteinuria and 24 hour urine was ordered. She has a body habitus post suggestive of  obstructive sleep apnea. This has been scheduled for her in the past and will be rescheduled. Her biggest problem is her morbid obesity. On GYN exam she did have a cervical polyp with a history of irregular menses suggestive of polycystic ovary disease. Referral made to GYN.    Gross sideeffects, risk and benefits, and alternatives of medications d/w patient. Patient is aware that all medications have potential sideeffects and we are unable to predict every sideeffect or drug-drug interaction that may occur.  Lesle Chris MD 08/22/2015 1:47 PM

## 2015-08-23 ENCOUNTER — Other Ambulatory Visit: Payer: Self-pay | Admitting: Emergency Medicine

## 2015-08-23 DIAGNOSIS — E662 Morbid (severe) obesity with alveolar hypoventilation: Secondary | ICD-10-CM

## 2015-08-23 DIAGNOSIS — R739 Hyperglycemia, unspecified: Secondary | ICD-10-CM | POA: Insufficient documentation

## 2015-08-23 DIAGNOSIS — R0683 Snoring: Secondary | ICD-10-CM

## 2015-08-23 LAB — COMPREHENSIVE METABOLIC PANEL
ALT: 24 U/L (ref 6–29)
AST: 16 U/L (ref 10–30)
Albumin: 4 g/dL (ref 3.6–5.1)
Alkaline Phosphatase: 72 U/L (ref 33–115)
BILIRUBIN TOTAL: 0.3 mg/dL (ref 0.2–1.2)
BUN: 12 mg/dL (ref 7–25)
CALCIUM: 9.7 mg/dL (ref 8.6–10.2)
CO2: 25 mmol/L (ref 20–31)
CREATININE: 0.83 mg/dL (ref 0.50–1.10)
Chloride: 103 mmol/L (ref 98–110)
GLUCOSE: 91 mg/dL (ref 65–99)
Potassium: 4.4 mmol/L (ref 3.5–5.3)
SODIUM: 140 mmol/L (ref 135–146)
Total Protein: 6.9 g/dL (ref 6.1–8.1)

## 2015-08-23 LAB — PAP IG, CT-NG, RFX HPV ASCU
CHLAMYDIA PROBE AMP: NOT DETECTED
GC Probe Amp: NOT DETECTED

## 2015-08-23 LAB — LIPID PANEL
CHOL/HDL RATIO: 3.4 ratio (ref <=5.0)
Cholesterol: 148 mg/dL (ref 125–200)
HDL: 44 mg/dL — ABNORMAL LOW (ref >=46)
LDL CALC: 85 mg/dL (ref <130)
Triglycerides: 97 mg/dL (ref <150)
VLDL: 19 mg/dL (ref <30)

## 2015-08-29 ENCOUNTER — Other Ambulatory Visit: Payer: Self-pay | Admitting: Emergency Medicine

## 2015-08-29 DIAGNOSIS — R809 Proteinuria, unspecified: Secondary | ICD-10-CM

## 2015-08-29 LAB — PROTEIN, URINE, 24 HOUR
PROTEIN 24H UR: 1575 mg/(24.h) — AB (ref <150)
PROTEIN, URINE: 75 mg/dL — AB (ref 5–24)

## 2015-09-06 ENCOUNTER — Encounter: Payer: Self-pay | Admitting: Women's Health

## 2015-09-06 ENCOUNTER — Ambulatory Visit (INDEPENDENT_AMBULATORY_CARE_PROVIDER_SITE_OTHER): Payer: BLUE CROSS/BLUE SHIELD | Admitting: Women's Health

## 2015-09-06 VITALS — BP 132/80 | Ht 63.0 in | Wt 323.0 lb

## 2015-09-06 DIAGNOSIS — N938 Other specified abnormal uterine and vaginal bleeding: Secondary | ICD-10-CM | POA: Diagnosis not present

## 2015-09-06 DIAGNOSIS — L680 Hirsutism: Secondary | ICD-10-CM

## 2015-09-06 DIAGNOSIS — G932 Benign intracranial hypertension: Secondary | ICD-10-CM | POA: Diagnosis not present

## 2015-09-06 MED ORDER — MEDROXYPROGESTERONE ACETATE 10 MG PO TABS: 10.0000 mg | ORAL_TABLET | Freq: Every day | ORAL | Status: DC

## 2015-09-06 NOTE — Patient Instructions (Signed)

## 2015-09-06 NOTE — Progress Notes (Signed)
Patient ID: Gabriela Cantu, female   DOB: 03/25/1991, 25 y.o.   MRN: [id number] Presents with vaginal bleeding since approximately 08/03/15- started as spotting, then had several days of increased bleeding and has been spotting since. Denies abdominal/pelvic pain. Presented to urgent care 2 weeks ago- Labs: Normal CBC/CMP, TSH 1.1, normal lipids, urine protein 75, A1c 6.1%, negative UPT, negative UA, normal pap. Has noticed an increased hair growth on chin over the past 6 months. Mother with ESRD on dialysis, died at [address]60 from an accident. Not sexually active since summer of 2016, first partner for both. Weight increase over 100 lbs in past 3 years.  VP shunt in 2014.  Exam: Morbidly obesity. External genitalia normal. Speculum exam: small amount of menses-type blood. No discharge, odor, or erythema. Cervix normal.   Dysfunctional uterine bleeding Morbid obesity  Plan: Rx for Provera 10mg  given, instructed to take 1 tablet x10 days to stop bleeding. Transvaginal US after bleeding has stopped. Aware to call if bleeding worsens or does not stop after taking Provera. Testosterone, prolactin, UPT. Reviewed possible OCPs for cycle regulation. Reviewed importance of increasing exercise, decreasing calories for weight loss, weight loss surgery reviewed.

## 2015-09-07 ENCOUNTER — Ambulatory Visit (INDEPENDENT_AMBULATORY_CARE_PROVIDER_SITE_OTHER): Payer: BLUE CROSS/BLUE SHIELD | Admitting: Neurology

## 2015-09-07 ENCOUNTER — Encounter: Payer: Self-pay | Admitting: Neurology

## 2015-09-07 ENCOUNTER — Other Ambulatory Visit: Payer: Self-pay | Admitting: Women's Health

## 2015-09-07 VITALS — BP 132/88 | HR 80 | Resp 18 | Ht 63.0 in | Wt 323.0 lb

## 2015-09-07 DIAGNOSIS — R51 Headache: Secondary | ICD-10-CM | POA: Diagnosis not present

## 2015-09-07 DIAGNOSIS — R0683 Snoring: Secondary | ICD-10-CM

## 2015-09-07 DIAGNOSIS — G4719 Other hypersomnia: Secondary | ICD-10-CM

## 2015-09-07 DIAGNOSIS — L680 Hirsutism: Secondary | ICD-10-CM

## 2015-09-07 DIAGNOSIS — R519 Headache, unspecified: Secondary | ICD-10-CM

## 2015-09-07 LAB — PREGNANCY, URINE: Preg Test, Ur: NEGATIVE

## 2015-09-07 LAB — PROLACTIN: Prolactin: 12.7 ng/mL

## 2015-09-07 LAB — TESTOSTERONE: Testosterone: 86 ng/dL

## 2015-09-07 MED ORDER — SPIRONOLACTONE 50 MG PO TABS: 50.0000 mg | ORAL_TABLET | Freq: Every day | ORAL | Status: DC

## 2015-09-07 NOTE — Progress Notes (Signed)
Subjective:    Patient ID: Gabriela Cantu is a 25 y.o. female.  HPI     [name], MD, PhD [facility] [address], [address] [address] [place], [place] [address]  Dear Dr. Cleta Albertsaub,   I saw your patient, Gabriela Cantu, upon your kind request in my neurologic clinic today for initial consultation of her sleep disorder, in particular, concern for underlying obstructive sleep apnea. The patient is unaccompanied today. As you know, Gabriela Cantu is a 25 year old right-handed woman with an underlying medical history of pseudotumor cerebri (followed by Dr. Arbutus Leasat at [facility]), status post VP shunt placement in February 2014 under Dr. Venetia MaxonStern, DUB and morbid obesity, who reports snoring and excessive daytime somnolence.  She has no FHx of OSA, but she has accompanied her uncle to his appointment with me before, and is familiar with OSA and sleep study testing. She lives with her MA and aunt's husband.   She is single, has no children. She does not smoke or drink alcohol. She drinks caffeine about 3 times a week in the form of soda. 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 Bedtime is around 9:30 or 10 PM. Wakeup time is for a.m. When she sleeps more than 8 hours she wakes up with a headache typically. She feels like she does better when she sleeps about 5 hours. She denies nocturia, restless leg symptoms, leg twitching at night, but does report daytime tiredness and somnolence, Epworth Sleepiness Scale score is 15 out of 24 today, fatigue score is 15 out of 63. She is trying to lose weight.   I reviewed your office note from 08/22/2015.  Her Past Medical History Is Significant For: No past medical history on file.  Her Past Surgical History Is Significant For: Past Surgical History  Procedure Laterality Date  . Ventriculoperitoneal shunt N/A 07/28/2012    Procedure: SHUNT INSERTION VENTRICULAR-PERITONEAL;  Surgeon: Maeola HarmanJoseph Stern, MD;  Location: [facility];  Service: Neurosurgery;   Laterality: N/A;  Ventricular-peritoneal shunt placement with Dr. Abbey Chattersosenbower  . Laparoscopic revision ventricular-peritoneal (v-p) shunt Left 07/28/2012    Procedure: LAPAROSCOPIC REVISION VENTRICULAR-PERITONEAL (V-P) SHUNT;  Surgeon: Adolph Pollackodd J Rosenbower, MD;  Location: [facility];  Service: General;  Laterality: Left;  Ventricular-peritoneal shunt placement with Dr. Abbey Chattersosenbower    Her Family History Is Significant For: Family History  Problem Relation Age of Onset  . Hypertension Mother   . Kidney disease Mother   . Hypertension Maternal Aunt   . Hypertension Maternal Grandmother   . Hypertension Maternal Grandfather     Her Social History Is Significant For: Social History   Social History  . Marital Status: Single    Spouse Name: N/A  . Number of Children: N/A  . Years of Education: S   Occupational History  . housekeeping    Social History Main Topics  . Smoking status: Never Smoker   . Smokeless tobacco: Never Used  . Alcohol Use: No  . Drug Use: No  . Sexual Activity: Not Currently     Comment: 1ST INTERCOURSE- 24, PARTNERS- 1    Other Topics Concern  . None   Social History Narrative   Drinks caffeine once a week     Her Allergies Are:  No Known Allergies:   Her Current Medications Are:  Outpatient Encounter Prescriptions as of 09/07/2015  Medication Sig  . Ascorbic Acid (VITAMIN C) 100 MG tablet Take 100 mg by mouth daily.  . medroxyPROGESTERone (PROVERA) 10 MG tablet Take 1  tablet (10 mg total) by mouth daily.  Marland Kitchen spironolactone (ALDACTONE) 50 MG tablet Take 1 tablet (50 mg total) by mouth daily.   No facility-administered encounter medications on file as of 09/07/2015.  :  Review of Systems:  Out of a complete 14 point review of systems, all are reviewed and negative with the exception of these symptoms as listed below:   Review of Systems  Neurological:       Snoring, wakes up feeling tired, daytime sleepiness, morning headaches, occasionally takes a  nap.    Epworth Sleepiness Scale 0= would never doze 1= slight chance of dozing 2= moderate chance of dozing 3= high chance of dozing  Sitting and reading:3 Watching TV:3 Sitting inactive in a public place (ex. Theater or meeting):0 As a passenger in a car for an hour without a break:3 Lying down to rest in the afternoon:3 Sitting and talking to someone:0 Sitting quietly after lunch (no alcohol):3 In a car, while stopped in traffic:0 Total:15  Objective:  Neurologic Exam  Physical Exam Physical Examination:   Filed Vitals:   09/07/15 1535  BP: 132/88  Pulse: 80  Resp: 18    General Examination: The patient is a very pleasant 25 y.o. female in no acute distress. She appears well-developed and well-nourished and well groomed.   HEENT: Normocephalic, atraumatic, pupils are equal, round and reactive to light and accommodation. Funduscopic exam is normal with sharp disc margins noted. She reports some decrease in vision partially in the right eye, better with eyeglasses and right eye vision damage from before she had the VP shunt placed. Extraocular tracking is good without limitation to gaze excursion or nystagmus noted. Normal smooth pursuit is noted. Hearing is grossly intact. Face is symmetric with normal facial animation and normal facial sensation. Speech is clear with no dysarthria noted. There is no hypophonia. There is no lip, neck/head, jaw or voice tremor. Neck is supple with full range of passive and active motion. There are no carotid bruits on auscultation. Oropharynx exam reveals: mild mouth dryness, good dental hygiene and marked airway crowding, due smaller airway entry, larger uvula and thicker soft palate and larger tonsils of about 3+ bilaterally. Mallampati is class II. Tongue protrudes centrally and palate elevates symmetrically. Neck size is 18 5/8 inches. She also appears to have a shorter neck.She  has a Mild overbite.   Chest: Clear to auscultation without  wheezing, rhonchi or crackles noted.  Heart: S1+S2+0, regular and normal without murmurs, rubs or gallops noted.   Abdomen: Soft, non-tender and non-distended with normal bowel sounds appreciated on auscultation.  Extremities: There is no pitting edema in the distal lower extremities bilaterally. Pedal pulses are intact.  Skin: Warm and dry without trophic changes noted.  Musculoskeletal: exam reveals no obvious joint deformities, tenderness or joint swelling or erythema.   Neurologically:  Mental status: The patient is awake, alert and oriented in all 4 spheres. Her immediate and remote memory, attention, language skills and fund of knowledge are appropriate. There is no evidence of aphasia, agnosia, apraxia or anomia. Speech is clear with normal prosody and enunciation. Thought process is linear. Mood is normal and affect is normal.  Cranial nerves II - XII are as described above under HEENT exam. In addition: shoulder shrug is normal with equal shoulder height noted. Motor exam: Normal bulk, strength and tone is noted. There is no drift, tremor or rebound. Romberg is negative. Reflexes are 1+ throughout. Babinski: Toes are flexor bilaterally. Fine motor skills and coordination:  intact with normal finger taps, normal hand movements, normal rapid alternating patting, normal foot taps and normal foot agility.  Cerebellar testing: No dysmetria or intention tremor on finger to nose testing. Heel to shin is unremarkable bilaterally. There is no truncal or gait ataxia.  Sensory exam: intact to light touch, pinprick, vibration, temperature sense in the upper and lower extremities.  Gait, station and balance: She stands easily. No veering to one side is noted. No leaning to one side is noted. Posture is age-appropriate and stance is narrow based. Gait shows normal stride length and normal pace. No problems turning are noted. She turns en bloc. Tandem walk is unremarkable.  Assessment and Plan:  In  summary, Gabriela Cantu is a very pleasant 25 y.o.-year old female with an underlying medical history of pseudotumor cerebri (followed by Dr. Arbutus Leas at [facility] neurology), status post VP shunt placement in February 2014 under Dr. Venetia Maxon, DUB and  morbid obesity, whose history and physical exam are concerning for obstructive sleep apnea (OSA). I had a long chat with the patient about my findings and the diagnosis of OSA, its prognosis and treatment options. We talked about medical treatments, surgical interventions and non-pharmacological approaches. I explained in particular the risks and ramifications of untreated moderate to severe OSA, especially with respect to developing cardiovascular disease down the Road, including congestive heart failure, difficult to treat hypertension, cardiac arrhythmias, or stroke. Even type 2 diabetes has, in part, been linked to untreated OSA. Symptoms of untreated OSA include daytime sleepiness, memory problems, mood irritability and mood disorder such as depression and anxiety, lack of energy, as well as recurrent headaches, especially morning headaches. We talked about trying to maintain a healthy lifestyle in general, as well as the importance of weight control. I encouraged the patient to eat healthy, exercise daily and keep well hydrated, to keep a scheduled bedtime and wake time routine, to not skip any meals and eat healthy snacks in between meals. I advised the patient not to drive when feeling sleepy. I recommended the following at this time: sleep study with potential positive airway pressure titration. (We will score hypopneas at 3% and split the sleep study into diagnostic and treatment portion, if the estimated. 2 hour AHI is >15/h).   I explained the sleep test procedure to the patient and also outlined possible surgical and non-surgical treatment options of OSA, including the use of a custom-made dental device (which would require a referral to a specialist dentist or  oral surgeon), upper airway surgical options, such as pillar implants, radiofrequency surgery, tongue base surgery, and UPPP (which would involve a referral to an ENT surgeon). Rarely, jaw surgery such as mandibular advancement may be considered.  I also explained the CPAP treatment option to the patient, who indicated that she would be willing to try CPAP if the need arises. I explained the importance of being compliant with PAP treatment, not only for insurance purposes but primarily to improve Her symptoms, and for the patient's long term health benefit, including to reduce Her cardiovascular risks. I answered all her questions today and the patient was in agreement. I would like to see her back after the sleep study is completed and encouraged her to call with any interim questions, concerns, problems or updates.   Thank you very much for allowing me to participate in the care of this nice patient. If I can be of any further assistance to you please do not hesitate to call me at [phone].  Sincerely,  Star Age, MD, PhD

## 2015-09-07 NOTE — Patient Instructions (Signed)

## 2015-09-11 ENCOUNTER — Encounter: Payer: Self-pay | Admitting: *Deleted

## 2015-09-13 ENCOUNTER — Encounter: Payer: Self-pay | Admitting: Women's Health

## 2015-09-13 ENCOUNTER — Ambulatory Visit (INDEPENDENT_AMBULATORY_CARE_PROVIDER_SITE_OTHER): Payer: BLUE CROSS/BLUE SHIELD

## 2015-09-13 ENCOUNTER — Ambulatory Visit (INDEPENDENT_AMBULATORY_CARE_PROVIDER_SITE_OTHER): Payer: BLUE CROSS/BLUE SHIELD | Admitting: Women's Health

## 2015-09-13 ENCOUNTER — Other Ambulatory Visit: Payer: Self-pay | Admitting: Women's Health

## 2015-09-13 VITALS — Ht 63.0 in | Wt 323.0 lb

## 2015-09-13 DIAGNOSIS — N938 Other specified abnormal uterine and vaginal bleeding: Secondary | ICD-10-CM

## 2015-09-13 DIAGNOSIS — R9389 Abnormal findings on diagnostic imaging of other specified body structures: Secondary | ICD-10-CM

## 2015-09-13 DIAGNOSIS — R938 Abnormal findings on diagnostic imaging of other specified body structures: Secondary | ICD-10-CM | POA: Diagnosis not present

## 2015-09-13 DIAGNOSIS — Z30011 Encounter for initial prescription of contraceptive pills: Secondary | ICD-10-CM

## 2015-09-13 MED ORDER — NORETHIN-ETH ESTRAD-FE BIPHAS 1 MG-10 MCG / 10 MCG PO TABS: ORAL_TABLET | ORAL | Status: DC

## 2015-09-13 NOTE — Progress Notes (Signed)
Patient ID: [place], female   DOB: 01/15/1991, 25 y.o.   MRN: [id number] Presents for ultrasound. Limited pelvic at annual exam, weight 323. Having irregular cycles, had one month of bleeding/spotting, increased hirsutism, weight gain. Normal TSH and prolactin, slightly elevated testosterone. Was given Provera 10 mg by mouth daily, bleeding stopped. Was found to have proteinuria at primary care has follow-up scheduled with nephrologist.  Ultrasound: T/V anteverted uterus homogeneous. Endometrium 7.5 mm. Right ovary and left ovary normal echo with numerous follicles less than 7 mm, increased ovarian volume bilateral ovaries, right ovary 13 cc, left ovary 17.2 cc. Fluid in cul-de-sac 34 x 15 mm.  PCO S Morbid obesity  Plan: Continue spironolactone daily. Options reviewed, will try lo Loestrin prescription, proper use, slight risk for blood clots and strokes. Condoms encouraged if sexually active. Will start today. Instructed to call if cycles do not regulate. Reviewed importance of increasing exercise and decreasing calories for weight loss, decreasing carbs in diet. Keep scheduled follow-up with nephrologist for proteinuria.

## 2015-09-13 NOTE — Patient Instructions (Signed)
Polycystic Ovarian Syndrome  Polycystic ovarian syndrome (PCOS) is a common hormonal disorder among women of reproductive age. Most women with PCOS grow many small cysts on their ovaries. PCOS can cause problems with your periods and make it difficult to get pregnant. It can also cause an increased risk of miscarriage with pregnancy. If left untreated, PCOS can lead to serious health problems, such as diabetes and heart disease.  CAUSES  The cause of PCOS is not fully understood, but genetics may be a factor.  SIGNS AND SYMPTOMS   · Infrequent or no menstrual periods.    · Inability to get pregnant (infertility) because of not ovulating.    · Increased growth of hair on the face, chest, stomach, back, thumbs, thighs, or toes.    · Acne, oily skin, or dandruff.    · Pelvic pain.    · Weight gain or obesity, usually carrying extra weight around the waist.    · Type 2 diabetes.     · High cholesterol.    · High blood pressure.    · Female-pattern baldness or thinning hair.    · Patches of thickened and dark brown or black skin on the neck, arms, breasts, or thighs.    · Tiny excess flaps of skin (skin tags) in the armpits or neck area.    · Excessive snoring and having breathing stop at times while asleep (sleep apnea).    · Deepening of the voice.    · Gestational diabetes when pregnant.    DIAGNOSIS   There is no single test to diagnose PCOS.   · Your health care provider will:      Take a medical history.      Perform a pelvic exam.      Have ultrasonography done.      Check your female and female hormone levels.      Measure glucose or sugar levels in the blood.      Do other blood tests.    · If you are producing too many female hormones, your health care provider will make sure it is from PCOS. At the physical exam, your health care provider will want to evaluate the areas of increased hair growth. Try to allow natural hair growth for a few days before the visit.    · During a pelvic exam, the ovaries may be enlarged  or swollen because of the increased number of small cysts. This can be seen more easily by using vaginal ultrasonography or screening to examine the ovaries and lining of the uterus (endometrium) for cysts. The uterine lining may become thicker if you have not been having a regular period.    TREATMENT   Because there is no cure for PCOS, it needs to be managed to prevent problems. Treatments are based on your symptoms. Treatment is also based on whether you want to have a baby or whether you need contraception.   Treatment may include:   · Progesterone hormone to start a menstrual period.    · Birth control pills to make you have regular menstrual periods.    · Medicines to make you ovulate, if you want to get pregnant.    · Medicines to control your insulin.    · Medicine to control your blood pressure.    · Medicine and diet to control your high cholesterol and triglycerides in your blood.  · Medicine to reduce excessive hair growth.   · Surgery, making small holes in the ovary, to decrease the amount of female hormone production. This is done through a long, lighted tube (laparoscope) placed into the pelvis through a tiny incision in the lower abdomen.      HOME CARE INSTRUCTIONS  · Only take over-the-counter or prescription medicine as directed by your health care provider.  · Pay attention to the foods you eat and your activity levels. This can help reduce the effects of PCOS.    Keep your weight under control.    Eat foods that are low in carbohydrate and high in fiber.    Exercise regularly.  SEEK MEDICAL CARE IF:  · Your symptoms do not get better with medicine.  · You have new symptoms.     This information is not intended to replace advice given to you by your health care provider. Make sure you discuss any questions you have with your health care provider.     Document Released: 09/27/2004 Document Revised: 03/24/2013 Document Reviewed: 11/19/2012  Elsevier Interactive Patient Education ©2016 Elsevier  Inc.

## 2015-10-25 DIAGNOSIS — I10 Essential (primary) hypertension: Secondary | ICD-10-CM | POA: Diagnosis not present

## 2015-10-25 DIAGNOSIS — R809 Proteinuria, unspecified: Secondary | ICD-10-CM | POA: Diagnosis not present

## 2015-10-27 ENCOUNTER — Other Ambulatory Visit: Payer: Self-pay | Admitting: Nephrology

## 2015-11-02 ENCOUNTER — Other Ambulatory Visit: Payer: Self-pay | Admitting: Nephrology

## 2015-11-02 DIAGNOSIS — R809 Proteinuria, unspecified: Secondary | ICD-10-CM

## 2015-12-11 ENCOUNTER — Other Ambulatory Visit: Payer: Medicaid Other

## 2015-12-11 ENCOUNTER — Other Ambulatory Visit: Payer: Self-pay

## 2015-12-11 DIAGNOSIS — Z30011 Encounter for initial prescription of contraceptive pills: Secondary | ICD-10-CM

## 2015-12-11 MED ORDER — NORETHIN-ETH ESTRAD-FE BIPHAS 1 MG-10 MCG / 10 MCG PO TABS: ORAL_TABLET | ORAL | Status: DC

## 2015-12-23 ENCOUNTER — Ambulatory Visit (INDEPENDENT_AMBULATORY_CARE_PROVIDER_SITE_OTHER): Payer: BLUE CROSS/BLUE SHIELD | Admitting: Internal Medicine

## 2015-12-23 VITALS — BP 140/90 | HR 135 | Temp 98.3°F | Resp 18 | Ht 63.0 in | Wt 324.0 lb

## 2015-12-23 DIAGNOSIS — J0100 Acute maxillary sinusitis, unspecified: Secondary | ICD-10-CM

## 2015-12-23 MED ORDER — AMOXICILLIN 875 MG PO TABS: 875.0000 mg | ORAL_TABLET | Freq: Two times a day (BID) | ORAL | Status: AC

## 2015-12-23 MED ORDER — FLUTICASONE PROPIONATE 50 MCG/ACT NA SUSP: 2.0000 | Freq: Every day | NASAL | Status: DC

## 2015-12-23 NOTE — Patient Instructions (Signed)
     IF you received an x-ray today, you will receive an invoice from [facility]. Please contact [facility] at [phone] with questions or concerns regarding your invoice.   IF you received labwork today, you will receive an invoice from [facility]. Please contact [facility] at [phone] with questions or concerns regarding your invoice.   Our billing staff will not be able to assist you with questions regarding bills from these companies.  You will be contacted with the lab results as soon as they are available. The fastest way to get your results is to activate your My Chart account. Instructions are located on the last page of this paperwork. If you have not heard from us regarding the results in 2 weeks, please contact this office.      

## 2015-12-23 NOTE — Progress Notes (Signed)
Subjective:  By signing my name below, I, Stann Oresung-Kai Tsai, attest that this documentation has been prepared under the direction and in the presence of Ellamae Siaobert Doolittle, MD. Electronically Signed: Stann Oresung-Kai Tsai, Scribe. 12/23/2015 , 8:37 AM .  Patient was seen in Room 9 .   Patient ID: Gabriela Cantu, female    DOB: 09/18/1990, 25 y.o.   MRN: [id number] Chief Complaint  Patient presents with  . Eye Drainage    Started yesterday   . Cough   HPI Gabriela Klawitter is a 25 y.o. female who [facility] to [facility] complaining of eye drainage and nasal drainage with cough that started 2 weeks ago. She has difficulty when breathing through her nose. She also mentions vomiting when she brushes her teeth due to mucus build up. She's taken nyquil-cough medication but stopped because she was taking it for too long. She denies waking up at night due to the coughs. She denies fever. She denies history of asthma.   Patient Active Problem List   Diagnosis Date Noted  . DUB (dysfunctional uterine bleeding) 09/06/2015  . Hyperglycemia 08/23/2015  . Pseudotumor cerebri 07/14/2012  . Obesity 07/14/2012  . Visual field loss 07/14/2012    Current outpatient prescriptions:  .  Flaxseed, Linseed, (FLAXSEED OIL) 1000 MG CAPS, Take by mouth., Disp: , Rfl:  .  Norethindrone-Ethinyl Estradiol-Fe Biphas (LO LOESTRIN FE) 1 MG-10 MCG / 10 MCG tablet, Take 1 tablet daily, Disp: 3 Package, Rfl: 2 .  spironolactone (ALDACTONE) 50 MG tablet, Take 1 tablet (50 mg total) by mouth daily., Disp: 30 tablet, Rfl: 12 .  medroxyPROGESTERone (PROVERA) 10 MG tablet, Take 1 tablet (10 mg total) by mouth daily. (Patient not taking: Reported on 12/23/2015), Disp: 10 tablet, Rfl: 0 No Known Allergies   Review of Systems  Constitutional: Positive for fatigue. Negative for fever and chills.  HENT: Positive for congestion, ear pain and rhinorrhea.   Eyes: Positive for discharge and redness.  Respiratory: Positive for cough. Negative for shortness of  breath and wheezing.   Gastrointestinal: Positive for nausea and vomiting. Negative for diarrhea.       Objective:   Physical Exam  Constitutional: She is oriented to person, place, and time. She appears well-developed and well-nourished. No distress.  HENT:  Head: Normocephalic and atraumatic.  Right Ear: Tympanic membrane normal.  Left Ear: Tympanic membrane normal.  Nose: Rhinorrhea (purulent) present.  Mouth/Throat: Oropharynx is clear and moist.  Eyes: EOM are normal. Pupils are equal, round, and reactive to light. Right conjunctiva is injected. Left conjunctiva is injected.  Neck: Neck supple.  Cardiovascular: Normal rate.   Pulmonary/Chest: Effort normal and breath sounds normal. No respiratory distress.  Musculoskeletal: Normal range of motion.  Lymphadenopathy:    She has no cervical adenopathy.  Neurological: She is alert and oriented to person, place, and time.  Skin: Skin is warm and dry.  Psychiatric: She has a normal mood and affect. Her behavior is normal.  Nursing note and vitals reviewed.   BP 140/90 mmHg  Pulse 135  Temp(Src) 98.3 F (36.8 C) (Oral)  Resp 18  Ht 5\' 3"  (1.6 m)  Wt 324 lb (146.965 kg)  BMI 57.41 kg/m2  SpO2 97%  LMP 10/23/2015 (Approximate)    Assessment & Plan:   Maxillary sinusitis with cough  Meds ordered this encounter  Medications  . Flaxseed, Linseed, (FLAXSEED OIL) 1000 MG CAPS    Sig: Take by mouth.  Marland Kitchen. amoxicillin (AMOXIL) 875 MG tablet    Sig: Take 1  tablet (875 mg total) by mouth 2 (two) times daily.    Dispense:  20 tablet    Refill:  0    Order Specific Question:  Supervising Provider    Answer:  DOOLITTLE, ROBERT P [[id number]]  . fluticasone (FLONASE) 50 MCG/ACT nasal spray    Sig: Place 2 sprays into both nostrils daily.    Dispense:  16 g    Refill:  0    Order Specific Question:  Supervising Provider    Answer:  DOOLITTLE, ROBERT P [[id number]]

## 2016-02-02 ENCOUNTER — Encounter: Payer: Self-pay | Admitting: Women's Health

## 2016-02-02 ENCOUNTER — Ambulatory Visit (INDEPENDENT_AMBULATORY_CARE_PROVIDER_SITE_OTHER): Payer: BLUE CROSS/BLUE SHIELD | Admitting: Women's Health

## 2016-02-02 VITALS — BP 124/80 | Ht 63.0 in | Wt 324.0 lb

## 2016-02-02 DIAGNOSIS — Z3009 Encounter for other general counseling and advice on contraception: Secondary | ICD-10-CM | POA: Diagnosis not present

## 2016-02-02 NOTE — Progress Notes (Signed)
Presents with concern about Lo Loestrin. Reports some months cycles are only spotting during the placebo week, others are a regular 3 or 4 day cycle. Denies bleeding or spotting between cycles. Questions what other options are available. Weight 324, BP 124/80. Getting married September 9, does not desire children for 3-4 years. Negative STD screen with  Partner.  Exam: Appears well, obese.  Contraception management  Plan: Contraception options reviewed would like to try Nexplanon, will check coverage, reviewed slight risk for spotting and irregular cycles. Will schedule placement with Dr. Lily PeerFernandez with next cycle. Instructed to continue lo Loestrin until Nexplanon placed.

## 2016-02-02 NOTE — Patient Instructions (Signed)
Etonogestrel implant What is this medicine? ETONOGESTREL (et oh noe JES trel) is a contraceptive (birth control) device. It is used to prevent pregnancy. It can be used for up to 3 years. This medicine may be used for other purposes; ask your health care provider or pharmacist if you have questions. What should I tell my health care provider before I take this medicine? They need to know if you have any of these conditions: -abnormal vaginal bleeding -blood vessel disease or blood clots -cancer of the breast, cervix, or liver -depression -diabetes -gallbladder disease -headaches -heart disease or recent heart attack -high blood pressure -high cholesterol -kidney disease -liver disease -renal disease -seizures -tobacco smoker -an unusual or allergic reaction to etonogestrel, other hormones, anesthetics or antiseptics, medicines, foods, dyes, or preservatives -pregnant or trying to get pregnant -breast-feeding How should I use this medicine? This device is inserted just under the skin on the inner side of your upper arm by a health care professional. Talk to your pediatrician regarding the use of this medicine in children. Special care may be needed. Overdosage: If you think you have taken too much of this medicine contact a poison control center or emergency room at once. NOTE: This medicine is only for you. Do not share this medicine with others. What if I miss a dose? This does not apply. What may interact with this medicine? Do not take this medicine with any of the following medications: -amprenavir -bosentan -fosamprenavir This medicine may also interact with the following medications: -barbiturate medicines for inducing sleep or treating seizures -certain medicines for fungal infections like ketoconazole and itraconazole -griseofulvin -medicines to treat seizures like carbamazepine, felbamate, oxcarbazepine, phenytoin,  topiramate -modafinil -phenylbutazone -rifampin -some medicines to treat HIV infection like atazanavir, indinavir, lopinavir, nelfinavir, tipranavir, ritonavir -St. John's wort This list may not describe all possible interactions. Give your health care provider a list of all the medicines, herbs, non-prescription drugs, or dietary supplements you use. Also tell them if you smoke, drink alcohol, or use illegal drugs. Some items may interact with your medicine. What should I watch for while using this medicine? This product does not protect you against HIV infection (AIDS) or other sexually transmitted diseases. You should be able to feel the implant by pressing your fingertips over the skin where it was inserted. Contact your doctor if you cannot feel the implant, and use a non-hormonal birth control method (such as condoms) until your doctor confirms that the implant is in place. If you feel that the implant may have broken or become bent while in your arm, contact your healthcare provider. What side effects may I notice from receiving this medicine? Side effects that you should report to your doctor or health care professional as soon as possible: -allergic reactions like skin rash, itching or hives, swelling of the face, lips, or tongue -breast lumps -changes in emotions or moods -depressed mood -heavy or prolonged menstrual bleeding -pain, irritation, swelling, or bruising at the insertion site -scar at site of insertion -signs of infection at the insertion site such as fever, and skin redness, pain or discharge -signs of pregnancy -signs and symptoms of a blood clot such as breathing problems; changes in vision; chest pain; severe, sudden headache; pain, swelling, warmth in the leg; trouble speaking; sudden numbness or weakness of the face, arm or leg -signs and symptoms of liver injury like dark yellow or brown urine; general ill feeling or flu-like symptoms; light-colored stools; loss of  appetite; nausea; right upper belly   pain; unusually weak or tired; yellowing of the eyes or skin -unusual vaginal bleeding, discharge -signs and symptoms of a stroke like changes in vision; confusion; trouble speaking or understanding; severe headaches; sudden numbness or weakness of the face, arm or leg; trouble walking; dizziness; loss of balance or coordination Side effects that usually do not require medical attention (Report these to your doctor or health care professional if they continue or are bothersome.): -acne -back pain -breast pain -changes in weight -dizziness -general ill feeling or flu-like symptoms -headache -irregular menstrual bleeding -nausea -sore throat -vaginal irritation or inflammation This list may not describe all possible side effects. Call your doctor for medical advice about side effects. You may report side effects to FDA at 1-800-FDA-1088. Where should I keep my medicine? This drug is given in a hospital or clinic and will not be stored at home. NOTE: This sheet is a summary. It may not cover all possible information. If you have questions about this medicine, talk to your doctor, pharmacist, or health care provider.    2016, Elsevier/Gold Standard. (2014-03-18 14:07:06)  

## 2016-02-06 ENCOUNTER — Telehealth: Payer: Self-pay

## 2016-02-06 NOTE — Telephone Encounter (Signed)
I called patient to let her know that I checked with her insurance co. And they cover the Nexplanon [id number]) and the insertion ([id number]) at 100% in the doctor's office. No pre-cert or prior autho required. Per Elnita Maxwellheryl 02/06/16 Call Ref #[id number].  Appt desk will call her back to schedule.

## 2016-02-15 ENCOUNTER — Ambulatory Visit (INDEPENDENT_AMBULATORY_CARE_PROVIDER_SITE_OTHER): Payer: BLUE CROSS/BLUE SHIELD | Admitting: Gynecology

## 2016-02-15 ENCOUNTER — Encounter: Payer: Self-pay | Admitting: Gynecology

## 2016-02-15 VITALS — BP 132/88

## 2016-02-15 DIAGNOSIS — Z30017 Encounter for initial prescription of implantable subdermal contraceptive: Secondary | ICD-10-CM

## 2016-02-15 DIAGNOSIS — Z975 Presence of (intrauterine) contraceptive device: Secondary | ICD-10-CM | POA: Insufficient documentation

## 2016-02-15 NOTE — Patient Instructions (Signed)
Etonogestrel implant Qu es este medicamento? El ETONOGESTREL es un dispositivo anticonceptivo (control de la natalidad). Se utiliza para Patent attorney. Se puede utilizar hasta 3 aos. Este medicamento puede ser utilizado para otros usos; si tiene alguna pregunta consulte con su proveedor de atencin mdica o con su farmacutico. Qu le debo informar a mi profesional de la salud antes de tomar este medicamento? Necesita saber si usted presenta alguno de los siguientes problemas o situaciones: sangrado vaginal anormal enfermedad vascular o cogulos sanguneos cncer de mama, cervical, heptico depresin diabetes enfermedad de la vescula biliar dolores de cabeza enfermedad cardiaca o ataque cardiaco reciente alta presin sangunea alto nivel de colesterol enfermedad renal enfermedad heptica convulsiones fuma tabaco una reaccin alrgica o inusual al etonogestrel, otras hormonas, anestsicos o antispticos, medicamentos, alimentos, colorantes o conservantes si est embarazada o buscando quedar embarazada si est amamantando a un beb Cmo debo [employer]? Este dispositivo se inserta debajo de la piel en la cara interna de la parte superior del brazo por un profesional de Technical sales engineer. Hable con su pediatra para informarse acerca del uso de este medicamento en nios. Puede requerir atencin especial. Sobredosis: Pngase en contacto inmediatamente con un centro toxicolgico o una sala de urgencia si usted cree que haya tomado demasiado medicamento. ATENCIN: [employer] es solo para usted. No comparta este medicamento con nadie. Qu sucede si me olvido de una dosis? No se aplica en este caso. Qu puede interactuar con este medicamento? No tome esta medicina con ninguno de los siguientes medicamentos: amprenavir bosentano fosamprenavir Esta medicina tambin puede interactuar con los siguientes medicamentos: medicamentos barbitricos para inducir el sueo o tratar  convulsiones ciertos medicamentos para las infecciones micticas tales como quetoconazol e itraconazol griseofulvina medicamentos para tratar convulsiones, tales como carbamazepina, felbamato, Radio producer, fenitona, topiramato modafinil fenilbutazona rifampicina algunos medicamentos para tratar la infeccin por VIH tales como atazanavir, indinavir, lopinavir, nelfinavir, tipranavir, ritonavir hierba de [place] Puede ser que esta lista no menciona todas las posibles interacciones. Informe a su profesional de [employer]	[place] de [employer] productos a base de hierbas, medicamentos de [employer] o suplementos nutritivos que est tomando. Si usted fuma, consume bebidas alcohlicas o si utiliza drogas ilegales, indqueselo tambin a su profesional de [employer]	[place]. Algunas sustancias pueden interactuar con su medicamento. A qu debo estar atento al usar [employer]? Este producto no protege contra la infeccin por el VIH ([place]) u otras enfermedades de transmisin sexual. Usted debe sentir el implante al presionar con las yemas de los dedos sobre la piel donde se insert. Contacte a su mdico si no se siente el implante y [place] un mtodo anticonceptivo no hormonal (como el condn) hasta que el mdico confirma que el implante est en su Environmental consultant. Si siente que el implante puede haber roto o doblado en su brazo, pngase en contacto con su proveedor de atencin mdica. Qu efectos secundarios puedo tener al [employer] este medicamento? Efectos secundarios que debe informar a su mdico o a Barrister's clerk de la salud tan pronto como sea posible: Chief of Staff como erupcin cutnea, picazn o urticarias, hinchazn de la cara, labios o lengua ndulos mamarios cambios de emociones o humor humor deprimido sangrado menstrual prolongado o abundante dolor, irritacin, hichazn o Ship broker de la insercin Investment banker, corporate de la insercin signos de infeccin en el lugar de la insercin, tales como fiebre y  Agricultural engineer, Social research officer, government o descarga de la piel signos de Media planner signos y sntomas de un cogulo sanguneo tales  como problemas respiratorios; cambios en la visin; dolor en el pecho; dolor de cabeza severo, repentino; dolor, hinchazn, clida en la pierna; dificultad para hablar; entumecimiento o debilidad repentina de la cara, brazo o pierna signos y sntomas de lesin al hgado como orina amarillo oscuro o marrn; sensacin general de estar enfermo o sntomas gripales; heces claras; prdida de apetito; nuseas; dolor en la regin abdominal superior derecha; cansancio o debilidad inusual; color amarillento de los ojos o la piel sangrado, flujo vaginal inusual signos y sntomas de un derrame cerebral tales como cambios en la visin; confusin; dificultad para hablar o entender; dolores de cabeza severos; entumecimiento o debilidad repentina de la cara, brazo o pierna; dificultad para andar; mareos; prdida del equilibrio o coordinacin Efectos secundarios que, por lo general, no requieren atencin mdica (debe informarlos a su mdico o a su profesional de la salud si persisten o si son molestos): acn dolor de espalda dolor de pecho cambios de peso mareos sensacin general de estar enfermo o sntomas gripales dolor de cabeza sangrado menstrual irregular nuseas dolor de garganta irritacin o inflamacin vaginal Puede ser que esta lista no menciona todos los posibles efectos secundarios. Comunquese a su mdico por asesoramiento mdico sobre los efectos secundarios. Usted puede informar los efectos secundarios a la FDA por telfono al 1-800-FDA-1088. Dnde debo guardar mi medicina? Este medicamento se administra en hospitales o clnicas y no necesitar guardarlo en su domicilio. ATENCIN: Este folleto es un resumen. Puede ser que no cubra toda la posible informacin. Si usted tiene preguntas acerca de esta medicina, consulte con su mdico, su farmacutico o su profesional de la salud.    2016, Elsevier/Gold  Standard. (2014-07-27 00:00:00)  

## 2016-02-15 NOTE — Progress Notes (Signed)
   Patient is a 25 year old who presented to the office today to have the Nexplanon subdermal implant placed for contraception. She had been on low low Estrin oral contraceptive pill and wanted to switch. Patient previously had been provided with literature information on this form of contraception outlining the risks benefits pros and cons.                                              Nexplanon Procedure Note (insertion)     The patient was laying on her back with her nondominant left arm flexed at the elbow and externally rotated. The insertion site was identified as the underside of the nondominant upper arm approximately 8 cm from the medial epicondyle of the humerus. 2 marks were made with a sterile marker: The first marked the spot where the Nexplanon  implant was to be inserted, and a second, marked a spot a few centimeters proximal to the first marke to guide the direction of the insertion. The area was cleansed with Betadine solution. The area was anesthetized with 1% lidocaine  (1 cc)  at the area the injection site and underneath the skin along the planned insertion tunnel. The preloaded disposable Nexplanon was removed from its sterile casing.  The applicator was held above the needle at the textured surface area. The transparent protector was removed. With a freehand, the skin was stretched around the insertion site with a thumb and index finger. The skin was then punctured with the tip of the needle angled at 30. The Nexplanon applicator was lowered to a horizontal position. While lifting the skin with the tip of the needle the needle was then slid to its full length. The applicator was kept in sitting position with a needle inserted to its full length. The purple slider was unlocked by pushing it slightly downward. The slider was fully moved back until it stopped. This allowed the implant to be in the final subdermal position and the needle was locked inside the body of the applicator. The  applicator was then removed. A Steri-Strip was made over the incision and a Kerlix wrap was placed which patient is to remove tomorrow. No complications patient tolerated procedure well and was released home with instructions.   [facility] Surgery CenterFERNANDEZ,JUAN HMD2:32 PMTD@  Lot number [id number]

## 2016-02-16 ENCOUNTER — Encounter: Payer: Self-pay | Admitting: Anesthesiology

## 2016-10-30 ENCOUNTER — Encounter: Payer: Self-pay | Admitting: Gynecology

## 2017-01-26 ENCOUNTER — Encounter (HOSPITAL_COMMUNITY): Payer: Self-pay

## 2017-01-26 ENCOUNTER — Emergency Department (HOSPITAL_COMMUNITY)
Admission: EM | Admit: 2017-01-26 | Discharge: 2017-01-26 | Disposition: A | Discharge status: Home / Self Care | Payer: 59 | Attending: Emergency Medicine | Admitting: Emergency Medicine

## 2017-01-26 DIAGNOSIS — K146 Glossodynia: Secondary | ICD-10-CM

## 2017-01-26 DIAGNOSIS — K149 Disease of tongue, unspecified: Secondary | ICD-10-CM | POA: Diagnosis present

## 2017-01-26 MED ORDER — MAGIC MOUTHWASH: 5.0000 mL | Freq: Three times a day (TID) | ORAL | 0 refills | Status: DC | PRN

## 2017-01-26 MED ORDER — LIDOCAINE VISCOUS 2 % MT SOLN
15.0000 mL | Freq: Once | OROMUCOSAL | Status: AC
Start: 2017-01-26 — End: 2017-01-26
  Administered 2017-01-26: 15 mL via OROMUCOSAL
  Filled 2017-01-26: qty 15

## 2017-01-26 NOTE — ED Provider Notes (Signed)
[facility]-EMERGENCY DEPT Provider Note   CSN: [id number] Arrival date & time: 01/26/17  1033     History   Chief Complaint No chief complaint on file.   HPI Gabriela Cantu is a 26 y.o. female.  26 yo F with a chief complaint of tongue pain. This been going on for the past few days to a week. Was preceded by upper respiratory infection. Anything that she puts in her mouth other than water causes her to have a burning sensation to the outside of her tongue.She denies biting her tongue or any other trauma. Denies dental pain. Denies fevers vomiting or diarrhea. Denies focal swelling. She does feel that her throat is mildly sore as well.   The history is provided by the patient and the spouse.  Illness  This is a new problem. The current episode started more than 1 week ago. The problem occurs constantly. The problem has not changed since onset.Pertinent negatives include no chest pain, no headaches and no shortness of breath. Nothing aggravates the symptoms. Nothing relieves the symptoms. She has tried nothing for the symptoms. The treatment provided no relief.    Past Medical History:  Diagnosis Date  . Morbid obesity (HCC)   . Pseudotumor cerebri     Patient Active Problem List   Diagnosis Date Noted  . Nexplanon in place 02/15/2016  . DUB (dysfunctional uterine bleeding) 09/06/2015  . Hyperglycemia 08/23/2015  . Pseudotumor cerebri 07/14/2012  . Obesity 07/14/2012  . Visual field loss 07/14/2012    Past Surgical History:  Procedure Laterality Date  . LAPAROSCOPIC REVISION VENTRICULAR-PERITONEAL (V-P) SHUNT Left 07/28/2012   Procedure: LAPAROSCOPIC REVISION VENTRICULAR-PERITONEAL (V-P) SHUNT;  Surgeon: Adolph Pollack, MD;  Location: [facility];  Service: General;  Laterality: Left;  Ventricular-peritoneal shunt placement with Dr. Abbey Chatters  . VENTRICULOPERITONEAL SHUNT N/A 07/28/2012   Procedure: SHUNT INSERTION VENTRICULAR-PERITONEAL;  Surgeon: Maeola Harman, MD;  Location: [facility]UR[facility] OR[facility];  Service: Neurosurgery;  Laterality: N/A;  Ventricular-peritoneal shunt placement with Dr. Abbey Chatters    OB History    Gravida Para Term Preterm AB Living   0 0 0 0 0 0   SAB TAB Ectopic Multiple Live Births   0 0 0 0         Home Medications    Prior to Admission medications   Medication Sig Start Date End Date Taking? Authorizing Provider  spironolactone (ALDACTONE) 50 MG tablet Take 1 tablet (50 mg total) by mouth daily. 09/07/15  Yes Harrington Challenger, NP  magic mouthwash SOLN Take 5 mLs by mouth 3 (three) times daily as needed for mouth pain. 01/26/17   Melene Plan, DO    Family History Family History  Problem Relation Age of Onset  . Hypertension Mother   . Kidney disease Mother   . Hypertension Maternal Aunt   . Hypertension Maternal Grandmother   . Hypertension Maternal Grandfather     Social History Social History  Substance Use Topics  . Smoking status: Never Smoker  . Smokeless tobacco: Never Used  . Alcohol use No     Allergies   Patient has no known allergies.   Review of Systems Review of Systems  Constitutional: Negative for chills and fever.  HENT: Negative for congestion, mouth sores, rhinorrhea and trouble swallowing.        Mouth pain  Eyes: Negative for redness and visual disturbance.  Respiratory: Negative for shortness of breath and wheezing.   Cardiovascular: Negative for chest pain and palpitations.  Gastrointestinal: Negative  for nausea and vomiting.  Genitourinary: Negative for dysuria and urgency.  Musculoskeletal: Negative for arthralgias and myalgias.  Skin: Negative for pallor and wound.  Neurological: Negative for dizziness and headaches.     Physical Exam Updated Vital Signs BP (!) 146/100 (BP Location: Right Arm)   Pulse 96   Temp 98.4 F (36.9 C) (Oral)   Resp 20   Ht 5\' 3"  (1.6 m)   LMP  (LMP Unknown)   SpO2 100%   Physical Exam  Constitutional: She is oriented to person, place, and time. She appears  well-developed and well-nourished. No distress.  HENT:  Head: Normocephalic and atraumatic.  No dental caries, no noted tongue swelling or lesions.  No posterior oropharyngeal edema.    Eyes: Pupils are equal, round, and reactive to light. EOM are normal.  Neck: Normal range of motion. Neck supple.  Cardiovascular: Normal rate and regular rhythm.  Exam reveals no gallop and no friction rub.   No murmur heard. Pulmonary/Chest: Effort normal. She has no wheezes. She has no rales.  Abdominal: Soft. She exhibits no distension. There is no tenderness. There is no guarding.  Musculoskeletal: She exhibits no edema or tenderness.  Neurological: She is alert and oriented to person, place, and time.  Skin: Skin is warm and dry. She is not diaphoretic.  Psychiatric: She has a normal mood and affect. Her behavior is normal.  Nursing note and vitals reviewed.    ED Treatments / Results  Labs (all labs ordered are listed, but only abnormal results are displayed) Labs Reviewed - No data to display  EKG  EKG Interpretation None       Radiology No results found.  Procedures Procedures (including critical care time)  Medications Ordered in ED Medications  lidocaine (XYLOCAINE) 2 % viscous mouth solution 15 mL (15 mLs Mouth/Throat Given 01/26/17 1215)     Initial Impression / Assessment and Plan / ED Course  I have reviewed the triage vital signs and the nursing notes.  Pertinent labs & imaging results that were available during my care of the patient were reviewed by me and considered in my medical decision making (see chart for details).     26 yo F with a chief complaint of mouth pain.Exam with no findings. I'm unsure of cause for pain. We'll prescribe her Magic mouthwash for her symptomatically therapy. Have her follow-up with the family doctor.  12:20 PM:  I have discussed the diagnosis/risks/treatment options with the patient and family and believe the pt to be eligible for  discharge home to follow-up with PCP. We also discussed returning to the ED immediately if new or worsening sx occur. We discussed the sx which are most concerning (e.g., sudden worsening pain, fever, inability to tolerate by mouth) that necessitate immediate return. Medications administered to the patient during their visit and any new prescriptions provided to the patient are listed below.  Medications given during this visit Medications  lidocaine (XYLOCAINE) 2 % viscous mouth solution 15 mL (15 mLs Mouth/Throat Given 01/26/17 1215)     The patient appears reasonably screen and/or stabilized for discharge and I doubt any other medical condition or other [facility] requiring further screening, evaluation, or treatment in the ED at this time prior to discharge.   Final Clinical Impressions(s) / ED Diagnoses   Final diagnoses:  Tongue pain    New Prescriptions Discharge Medication List as of 01/26/2017 12:08 PM    START taking these medications   Details  magic mouthwash SOLN Take  5 mLs by mouth 3 (three) times daily as needed for mouth pain., Starting Sun 01/26/2017, Print         Adela Lank, Jesusita Oka, DO 01/26/17 1220

## 2017-01-26 NOTE — Discharge Instructions (Signed)
Call to establish a pcp, return for worsening symptoms, fever, ulcers.

## 2017-01-26 NOTE — ED Triage Notes (Signed)
Patient presents with c/o tongue pain started last Monday and she unable to eat or drink due to pain.

## 2017-08-19 DIAGNOSIS — H5213 Myopia, bilateral: Secondary | ICD-10-CM | POA: Diagnosis not present

## 2017-08-19 DIAGNOSIS — G932 Benign intracranial hypertension: Secondary | ICD-10-CM | POA: Diagnosis not present

## 2017-08-19 DIAGNOSIS — H4720 Unspecified optic atrophy: Secondary | ICD-10-CM | POA: Diagnosis not present

## 2017-08-28 DIAGNOSIS — R51 Headache: Secondary | ICD-10-CM | POA: Diagnosis not present

## 2017-08-28 DIAGNOSIS — M2669 Other specified disorders of temporomandibular joint: Secondary | ICD-10-CM | POA: Diagnosis not present

## 2017-09-19 ENCOUNTER — Encounter: Payer: Self-pay | Admitting: Physician Assistant

## 2017-09-19 ENCOUNTER — Ambulatory Visit: Payer: BLUE CROSS/BLUE SHIELD | Admitting: Physician Assistant

## 2017-09-19 ENCOUNTER — Ambulatory Visit (INDEPENDENT_AMBULATORY_CARE_PROVIDER_SITE_OTHER): Payer: BLUE CROSS/BLUE SHIELD

## 2017-09-19 VITALS — BP 126/92 | HR 118 | Temp 98.8°F | Resp 16 | Ht 64.0 in | Wt 339.2 lb

## 2017-09-19 DIAGNOSIS — R05 Cough: Secondary | ICD-10-CM

## 2017-09-19 DIAGNOSIS — R000 Tachycardia, unspecified: Secondary | ICD-10-CM

## 2017-09-19 DIAGNOSIS — J209 Acute bronchitis, unspecified: Secondary | ICD-10-CM

## 2017-09-19 DIAGNOSIS — R059 Cough, unspecified: Secondary | ICD-10-CM

## 2017-09-19 LAB — POCT CBC
Granulocyte percent: 76.7 % (ref 37–80)
HCT, POC: 43.5 % (ref 37.7–47.9)
Hemoglobin: 14.7 g/dL (ref 12.2–16.2)
Lymph, poc: 2.3 (ref 0.6–3.4)
MCH, POC: 28.1 pg (ref 27–31.2)
MCHC: 33.8 g/dL (ref 31.8–35.4)
MCV: 83.3 fL (ref 80–97)
MID (cbc): 0.4 (ref 0–0.9)
MPV: 8.4 fL (ref 0–99.8)
POC Granulocyte: 8.9 — AB (ref 2–6.9)
POC LYMPH PERCENT: 19.7 %L (ref 10–50)
POC MID %: 3.6 %M (ref 0–12)
Platelet Count, POC: 352 10*3/uL (ref 142–424)
RBC: 5.23 M/uL (ref 4.04–5.48)
RDW, POC: 13.1 %
WBC: 11.6 10*3/uL — AB (ref 4.6–10.2)

## 2017-09-19 LAB — POC INFLUENZA A&B (BINAX/QUICKVUE)
Influenza A, POC: NEGATIVE
Influenza B, POC: NEGATIVE

## 2017-09-19 MED ORDER — AZITHROMYCIN 250 MG PO TABS: ORAL_TABLET | ORAL | 0 refills | Status: DC

## 2017-09-19 MED ORDER — BENZONATATE 100 MG PO CAPS: 100.0000 mg | ORAL_CAPSULE | Freq: Three times a day (TID) | ORAL | 0 refills | Status: DC | PRN

## 2017-09-19 MED ORDER — HYDROCODONE-HOMATROPINE 5-1.5 MG/5ML PO SYRP: 5.0000 mL | ORAL_SOLUTION | Freq: Three times a day (TID) | ORAL | 0 refills | Status: DC | PRN

## 2017-09-19 NOTE — Progress Notes (Signed)
Gabriela Cantu  MRN: [id number] DOB: 1991-02-13  PCP: Patient, No Pcp Per  Subjective:  Pt is a pleasant 27 year old female who presents to clinic for cough x 4 days. She is here today with her mother. Symptoms started with sore throat, runny nose and chills.  Cough is productive and is keeping her up at night. Endorses lightheadedness.  Two episodes of vomiting last night and this morning.  She has been taking NyQuil - not helping,much.  Several contacts at work who are sick.   Review of Systems  Constitutional: Positive for chills and fatigue. Negative for diaphoresis and fever.  HENT: Positive for congestion, postnasal drip and sore throat. Negative for rhinorrhea, sinus pressure and sinus pain.   Respiratory: Positive for cough. Negative for shortness of breath and wheezing.   Cardiovascular: Negative for chest pain and palpitations.  Neurological: Positive for light-headedness.  Psychiatric/Behavioral: Negative for sleep disturbance.    Patient Active Problem List   Diagnosis Date Noted  . Nexplanon in place 02/15/2016  . DUB (dysfunctional uterine bleeding) 09/06/2015  . Hyperglycemia 08/23/2015  . Pseudotumor cerebri 07/14/2012  . Obesity 07/14/2012  . Visual field loss 07/14/2012    Current Outpatient Medications on File Prior to Visit  Medication Sig Dispense Refill  . spironolactone (ALDACTONE) 50 MG tablet Take 1 tablet (50 mg total) by mouth daily. (Patient not taking: Reported on 09/19/2017) 30 tablet 12   No current facility-administered medications on file prior to visit.     No Known Allergies   Objective:  BP (!) 126/92   Pulse (!) 118   Temp 98.8 F (37.1 C) (Oral)   Resp 16   Ht 5\' 4"  (1.626 m)   Wt (!) 339 lb 3.2 oz (153.9 kg)   SpO2 95%   BMI 58.22 kg/m   Physical Exam  Constitutional: She is oriented to person, place, and time. No distress.  HENT:  Right Ear: Tympanic membrane normal.  Left Ear: Tympanic membrane normal.  Nose: Mucosal  edema present. No rhinorrhea. Right sinus exhibits no maxillary sinus tenderness and no frontal sinus tenderness. Left sinus exhibits no maxillary sinus tenderness and no frontal sinus tenderness.  Mouth/Throat: Oropharynx is clear and moist and mucous membranes are normal.  Cardiovascular: Normal rate, regular rhythm and normal heart sounds.  Pulmonary/Chest: Effort normal and breath sounds normal. No respiratory distress. She has no wheezes. She has no rales.  Neurological: She is alert and oriented to person, place, and time.  Skin: Skin is warm and dry.  Psychiatric: Judgment normal.  Vitals reviewed.  Results for orders placed or performed in visit on 09/19/17  POC Influenza A&B(BINAX/QUICKVUE)  Result Value Ref Range   Influenza A, POC Negative Negative   Influenza B, POC Negative Negative  POCT CBC  Result Value Ref Range   WBC 11.6 (A) 4.6 - 10.2 K/uL   Lymph, poc 2.3 0.6 - 3.4   POC LYMPH PERCENT 19.7 10 - 50 %L   MID (cbc) 0.4 0 - 0.9   POC MID % 3.6 0 - 12 %M   POC Granulocyte 8.9 (A) 2 - 6.9   Granulocyte percent 76.7 37 - 80 %G   RBC 5.23 4.04 - 5.48 M/uL   Hemoglobin 14.7 12.2 - 16.2 g/dL   HCT, POC 40.9 81.1 - 47.9 %   MCV 83.3 80 - 97 fL   MCH, POC 28.1 27 - 31.2 pg   MCHC 33.8 31.8 - 35.4 g/dL   RDW, POC 91.4 %  Platelet Count, POC 352 142 - 424 K/uL   MPV 8.4 0 - 99.8 fL   Dg Chest 2 View  Result Date: 09/19/2017 CLINICAL DATA:  Cough and tachycardia EXAM: CHEST - 2 VIEW COMPARISON:  None. FINDINGS: Lungs are clear. Heart size and pulmonary vascularity are normal. No adenopathy. A shunt catheter extends along the medial right hemithorax. No bone lesions. IMPRESSION: No edema or consolidation. Electronically Signed   By: Bretta BangWilliam  Woodruff III M.D.   On: 09/19/2017 15:48   Assessment and Plan :  1. Acute bronchitis, unspecified organism - azithromycin (ZITHROMAX) 250 MG tablet; Take 2 tabs PO x 1 dose, then 1 tab PO QD x 4 days  Dispense: 6 tablet; Refill: 0 -  Pt presents for cough x 4 days. Chest x-ray is negative. Negative flu. WBC count is elevated at 11.6 with a left shift. She is tachycardic. Plan to treat with azithromycin. RTC in 5-7 days if no improvement.  2. Cough 3. Tachycardia - POCT CBC - POC Influenza A&B(BINAX/QUICKVUE) - DG Chest 2 View; Future - HYDROcodone-homatropine (HYCODAN) 5-1.5 MG/5ML syrup; Take 5 mLs by mouth every 8 (eight) hours as needed for cough.  Dispense: 120 mL; Refill: 0 - benzonatate (TESSALON) 100 MG capsule; Take 1-2 capsules (100-200 mg total) by mouth 3 (three) times daily as needed for cough.  Dispense: 40 capsule; Refill: 0  Marco CollieWhitney Matej Sappenfield, PA-C  [facility] 09/19/2017 3:23 PM

## 2017-09-19 NOTE — Patient Instructions (Addendum)
Azithromycin is an antibiotic - take the ENTIRE COURSE, even if you start to feel better sooner.  Start taking mucinex for the next 3-4 days.  Hycodan is for cough at night.  Tessalon (benzonatate) is for cough during the day.  Come back if you are not improving in 5-7 days.   Stay well hydrated - drink at least 2 liters of water/daily. Get lost of rest. Wash your hands often.   -Foods that can help speed recovery: honey, garlic, chicken soup, elderberries, green tea.  -Supplements that can help speed recovery: vitamin C, zinc, elderberry extract, quercetin, ginseng, selenium -Supplement with prebiotics and probiotics:   Advil or ibuprofen for pain. Do not take Aspirin.  Drink enough water and fluids to keep your urine clear or pale yellow.  For sore throat: ? Gargle with 8 oz of salt water ( tsp of salt per 1 qt of water) as often as every 1-2 hours to soothe your throat.  Gargle liquid benadryl.  Cepacol throat lozenges (if you are not at risk for choking).  For sore throat try using a honey-based tea. Use 3 teaspoons of honey with juice squeezed from half lemon. Place shaved pieces of ginger into 1/2-1 cup of water and warm over stove top. Then mix the ingredients and repeat every 4 hours as needed.  Cough Syrup Recipe: Sweet Lemon & Honey Thyme  Ingredients a handful of fresh thyme sprigs   1 pint of water (2 cups)  1/2 cup honey (raw is best, but regular will do)  1/2 lemon chopped Instructions 1. Place the lemon in the pint jar and cover with the honey. The honey will macerate the lemons and draw out liquids which taste so delicious! 2. Meanwhile, toss the thyme leaves into a saucepan and cover them with the water. 3. Bring the water to a gentle simmer and reduce it to half, about a cup of tea. 4. When the tea is reduced and cooled a bit, strain the sprigs & leaves, add it into the pint jar and stir it well. 5. Give it a shake and use a spoonful as needed. 6. Store your  homemade cough syrup in the refrigerator for about a month.  What causes a cough? In adults, common causes of a cough include: ?An infection of the airways or lungs (such as the common cold) ?Postnasal drip - Postnasal drip is when mucus from the nose drips down or flows along the back of the throat. Postnasal drip can happen when people have: .A cold .Allergies .A sinus infection - The sinuses are hollow areas in the bones of the face that open into the nose. ?Lung conditions, like asthma and chronic obstructive pulmonary disease (COPD) - Both of these conditions can make it hard to breathe. COPD is usually caused by smoking. ?Acid reflux - Acid reflux is when the acid that is normally in your stomach backs up into your esophagus (the tube that carries food from your mouth to your stomach). ?A side effect from blood pressure medicines called "ACE inhibitors" ?Smoking cigarettes  Is there anything I can do on my own to get rid of my cough? Yes. To help get rid of your cough, you can: ?Use a humidifier in your bedroom ?Use an over-the-counter cough medicine, or suck on cough drops or hard candy ?Stop smoking, if you smoke ?If you have allergies, avoid the things you are allergic to (like pollen, dust, animals, or mold) If you have acid reflux, your doctor or nurse  will tell you which lifestyle changes can help reduce symptoms.     IF you received an x-ray today, you will receive an invoice from [facility]. Please contact [facility] at [phone] with questions or concerns regarding your invoice.   IF you received labwork today, you will receive an invoice from [place]. Please contact [facility] at [phone] with questions or concerns regarding your invoice.   Our billing staff will not be able to assist you with questions regarding bills from these companies.  You will be contacted with the lab results as soon as they are available. The fastest way to get your  results is to activate your My Chart account. Instructions are located on the last page of this paperwork. If you have not heard from [place] regarding the results in 2 weeks, please contact this office.

## 2017-10-07 DIAGNOSIS — H4720 Unspecified optic atrophy: Secondary | ICD-10-CM | POA: Diagnosis not present

## 2017-10-16 ENCOUNTER — Telehealth: Payer: Self-pay | Admitting: Physician Assistant

## 2017-10-16 NOTE — Telephone Encounter (Signed)
Copied from CRM [id number]. Topic: Quick Communication - See Telephone Encounter >> Oct 16, 2017  9:13 AM Louie Bun, Rosey Bath D wrote: CRM for notification. See Telephone encounter for: 10/16/17. Patient saw McVey on 09/19/17 for cough but her symptoms are worse and cough has not gone away, she would like to know what to do? Please call patient back, thanks.

## 2017-10-16 NOTE — Telephone Encounter (Signed)
Pt needs f/u visit.

## 2017-10-18 ENCOUNTER — Ambulatory Visit: Payer: BLUE CROSS/BLUE SHIELD | Admitting: Physician Assistant

## 2017-10-18 ENCOUNTER — Other Ambulatory Visit: Payer: Self-pay

## 2017-10-18 ENCOUNTER — Encounter: Payer: Self-pay | Admitting: Physician Assistant

## 2017-10-18 VITALS — BP 130/90 | HR 96 | Temp 98.5°F | Ht 63.4 in | Wt 335.4 lb

## 2017-10-18 DIAGNOSIS — J302 Other seasonal allergic rhinitis: Secondary | ICD-10-CM

## 2017-10-18 DIAGNOSIS — J040 Acute laryngitis: Secondary | ICD-10-CM | POA: Diagnosis not present

## 2017-10-18 DIAGNOSIS — R05 Cough: Secondary | ICD-10-CM

## 2017-10-18 DIAGNOSIS — R059 Cough, unspecified: Secondary | ICD-10-CM

## 2017-10-18 MED ORDER — GUAIFENESIN ER 1200 MG PO TB12: 1.0000 | ORAL_TABLET | Freq: Two times a day (BID) | ORAL | 1 refills | Status: DC | PRN

## 2017-10-18 MED ORDER — AZELASTINE HCL 0.1 % NA SOLN: 2.0000 | Freq: Two times a day (BID) | NASAL | 0 refills | Status: DC

## 2017-10-18 MED ORDER — HYDROCODONE-HOMATROPINE 5-1.5 MG/5ML PO SYRP: 5.0000 mL | ORAL_SOLUTION | Freq: Three times a day (TID) | ORAL | 0 refills | Status: DC | PRN

## 2017-10-18 MED ORDER — BENZONATATE 100 MG PO CAPS: 100.0000 mg | ORAL_CAPSULE | Freq: Three times a day (TID) | ORAL | 0 refills | Status: DC | PRN

## 2017-10-18 MED ORDER — FLUTICASONE PROPIONATE 50 MCG/ACT NA SUSP: 2.0000 | Freq: Every day | NASAL | 12 refills | Status: DC

## 2017-10-18 NOTE — Progress Notes (Signed)
Subjective:    Patient ID: Gabriela Cantu, female    DOB: [birth year], 27 y.o.   MRN: [id number] Chief Complaint  Patient presents with  . Cough    still having sore throat with pain in ears and throat. Follow up visit from 09/19/17    HPI  27 yo female presents for evaluation of cough, sore throat, ear pain. Previously seen 09/19/17 and prescribed azithromycin for acute bronchitis.  Her symptoms resolved while taking azithromycin and 5 days after finishing she started having a cough, sore throat and R ear pain. She has noticed some voice changes as well. Cough in the AM is yellowish, other times white. Throughout the day her cough is dry and hacky. She feels worse at night, notes worsening cough and some wheezing. Intermittent light-headedness, diminished food intake. Reports drinking a lot of water.  Several sick contacts at work.  She had been taking zyrtec, Claritin, sudafed, Advil sinus with some relief, but stopped because she thought it would interact with her hycodan.  Denies fevers, chills, nausea, vomiting, abdominal pain.  Patient cannot have aspirin due to shunt in her brain for pseudotumor cerebri, per her neurologist.   Review of Systems  Constitutional: Positive for appetite change, diaphoresis and fatigue. Negative for chills and fever.  HENT: Positive for ear pain, sore throat, trouble swallowing and voice change. Negative for congestion, ear discharge, postnasal drip, rhinorrhea, sinus pressure, sinus pain and tinnitus.   Eyes: Negative for photophobia, pain, discharge, redness, itching and visual disturbance.  Respiratory: Positive for cough and wheezing. Negative for shortness of breath.   Cardiovascular: Negative for chest pain, palpitations and leg swelling.  Gastrointestinal: Negative for abdominal pain, diarrhea, nausea and vomiting.  Endocrine: Negative.   Genitourinary: Negative for difficulty urinating, dysuria, flank pain, hematuria, pelvic pain and urgency.    Musculoskeletal: Negative.   Skin: Negative.   Allergic/Immunologic: Positive for environmental allergies.  Neurological: Negative for dizziness, weakness and headaches.  Hematological: Negative.   Psychiatric/Behavioral: Negative.      Patient Active Problem List   Diagnosis Date Noted  . Nexplanon in place 02/15/2016  . DUB (dysfunctional uterine bleeding) 09/06/2015  . Hyperglycemia 08/23/2015  . Pseudotumor cerebri 07/14/2012  . Obesity 07/14/2012  . Visual field loss 07/14/2012    Past Medical History:  Diagnosis Date  . Morbid obesity (HCC)   . Pseudotumor cerebri     Prior to Admission medications   Medication Sig Start Date End Date Taking? Authorizing Provider  azithromycin (ZITHROMAX) 250 MG tablet Take 2 tabs PO x 1 dose, then 1 tab PO QD x 4 days 09/19/17  Yes McVey, Madelaine Bhat, PA-C  benzonatate (TESSALON) 100 MG capsule Take 1-2 capsules (100-200 mg total) by mouth 3 (three) times daily as needed for cough. 09/19/17  Yes McVey, Madelaine Bhat, PA-C  HYDROcodone-homatropine (HYCODAN) 5-1.5 MG/5ML syrup Take 5 mLs by mouth every 8 (eight) hours as needed for cough. 09/19/17  Yes McVey, Madelaine Bhat, PA-C  spironolactone (ALDACTONE) 50 MG tablet Take 1 tablet (50 mg total) by mouth daily. Patient not taking: Reported on 09/19/2017 09/07/15   Harrington Challenger, NP    No Known Allergies     Objective:   Physical Exam  Constitutional: She is oriented to person, place, and time. She appears well-developed and well-nourished. No distress.  BP 130/90 (BP Location: Left Arm, Patient Position: Sitting, Cuff Size: Normal)   Pulse 96   Temp 98.5 F (36.9 C) (Oral)   Ht 5' 3.4" (1.61 m)  Wt (!) 335 lb 6.4 oz (152.1 kg)   SpO2 98%   BMI 58.67 kg/m     HENT:  Head: Normocephalic and atraumatic.  Right Ear: External ear normal.  Left Ear: External ear normal.  Nose: Mucosal edema present.  Mouth/Throat: Oropharynx is clear and moist and mucous membranes are  normal. No oropharyngeal exudate, posterior oropharyngeal edema, posterior oropharyngeal erythema or tonsillar abscesses.  Eyes: Conjunctivae and EOM are normal. Right eye exhibits no discharge. Left eye exhibits no discharge. No scleral icterus.  Neck: Normal range of motion. Neck supple. No tracheal deviation present. No thyromegaly present.  Cardiovascular: Normal rate, regular rhythm, normal heart sounds and intact distal pulses. Exam reveals no gallop and no friction rub.  No murmur heard. Pulmonary/Chest: Effort normal and breath sounds normal. No respiratory distress. She has no wheezes. She has no rales.  Abdominal: Soft. She exhibits no mass. There is no tenderness. There is no guarding.  Musculoskeletal: Normal range of motion. She exhibits no edema or deformity.  Lymphadenopathy:    She has no cervical adenopathy.  Neurological: She is alert and oriented to person, place, and time.  Skin: Skin is warm and dry. She is not diaphoretic. No erythema.  Psychiatric: She has a normal mood and affect. Her behavior is normal.       Assessment & Plan:  1. Cough Doubt continuation of bronchitis, likely allergic in nature causing throat irritation and PND  - benzonatate (TESSALON) 100 MG capsule; Take 1-2 capsules (100-200 mg total) by mouth 3 (three) times daily as needed for cough.  Dispense: 40 capsule; Refill: 0 - HYDROcodone-homatropine (HYCODAN) 5-1.5 MG/5ML syrup; Take 5 mLs by mouth every 8 (eight) hours as needed for cough.  Dispense: 75 mL; Refill: 0  2. Laryngitis Voice change due to chronic irritation of throat from PND, possible viral etiology, less likely. Continue supportive treatment   3. Seasonal allergic rhinitis, unspecified trigger Lung and HEENT exam reassuring, turbinate edema seen. Doubt bronchitis or bacterial etiology.  Continue supportive treatment for allergies to minimize symptoms and exacerbation.   - Guaifenesin (MUCINEX MAXIMUM STRENGTH) 1200 MG TB12;  Take 1 tablet (1,200 mg total) by mouth every 12 (twelve) hours as needed.  Dispense: 14 tablet; Refill: 1 - azelastine (ASTELIN) 0.1 % nasal spray; Place 2 sprays into both nostrils 2 (two) times daily. Use in each nostril as directed  Dispense: 30 mL; Refill: 0 - fluticasone (FLONASE) 50 MCG/ACT nasal spray; Place 2 sprays into both nostrils daily.  Dispense: 16 g; Refill: 12

## 2017-10-18 NOTE — Patient Instructions (Addendum)
Use Claritin, Zyrtec or Allegra every day, once per day.   1. Use the Azelastine nasal spray as needed for congestion. 2. Use the Flonase long term throughout the allergy season. 3. Use your cough syrup (Hycodan) at night to help you sleep. 4. Use the Tessalon pearls as needed for cough throughout the day. 5. Drink plenty of water and get some rest this weekend!  When your symptoms improve, you may stop taking all medications EXCEPT Flonase (this will prevent you from having worsening congestion through allergy season). If you start having symptoms again, add back the azelastine or the Claritin. If you begin coughing again, try the tessalon pearls.   Rinitis alrgica en adultos Allergic Rhinitis, Adult La rinitis alrgica es una reaccin alrgica que afecta la membrana mucosa que se encuentra en la nariz. Provoca estornudos, goteo o congestin nasal, y la sensacin de que baja mucosidad por la parte trasera de la garganta(goteo posnasal). La rinitis alrgica puede ser de leve a grave. [employer] tipos de rinitis alrgica:  Astronomer. Este tipo tambin se denomina fiebre del heno. Sucede nicamente durante algunas estaciones.  Perenne. Este tipo puede ocurrir en cualquier momento del ao.  Cules son las causas? Esta afeccin ocurre cuando el sistema de defensa del cuerpo(sistema inmunitario) reacciona a ciertas sustancias inofensivas llamadas alrgenos como si fueran grmenes.  La rinitis alrgica estacional se desencadena por el polen, que puede provenir del csped, los rboles y [place]. La rinitis alrgica perenne puede ser causada por:  Los caros del polvo en Advice worker.  La caspa de las [place].  Esporas del moho.  Cules son los signos o los sntomas? Los sntomas de esta afeccin incluyen lo siguiente:  Estornudos.  Nariz tapada o que gotea (congestin nasal).  Goteo posnasal.  Escozor en la Darene Lamer.  Ojos llorosos.  Dificultad para dormir.  Somnolencia  Administrator.  Cmo se diagnostica? Esta afeccin se puede diagnosticar en funcin de lo siguiente:  Sus antecedentes mdicos.  Un examen fsico.  Estudios para detectar afecciones relacionadas, como las siguientes: ? Asma. ? Ojo rojo. ? Infeccin en los odos. ? Infeccin de las vas respiratorias superiores.  Estudios para Location manager sus sntomas. Por ejemplo, anlisis de sangre y cutneos.  Cmo se trata? No hay cura para esta afeccin, pero el tratamiento puede ayudar a [employer] sntomas. El tratamiento puede incluir lo siguiente:  Tomar medicamentos que [employer] sntomas de la [place], [place] antihistamnicos. Medicamentos que pueden administrarse por inyeccin, aerosol nasal o pldoras.  Evitar el alrgeno.  Desensibilizacin. Este tratamiento implica que se le apliquen inyecciones continuas hasta que su cuerpo se vuelva menos sensible al alrgeno. Este tratamiento se puede realizar si otros tratamientos no son eficaces.  Si tomar medicamentos y Multimedia programmer alrgeno no funciona, se pueden recetar nuevos medicamentos ms fuertes.  Siga estas indicaciones en su casa:  Conozca a qu es Best boy. Los alrgenos comunes incluyen el humo, polvo y [place].  Evite las cosas a las cuales es alrgico. Hay medidas que puede tomar para ayudar a Automotive engineer los alrgenos: ? [employer] alfombras por pisos de [place], baldosas o vinilo. Las alfombras pueden retener la caspa de los animales y [place]. ? No fume. No permita que fumen en su casa. ? Cambie el filtro de la calefaccin y del aire acondicionado al menos una vez al mes. ? Durante la temporada de alergias:  Mantenga las ventanas cerradas la mayor cantidad de [place] posible.  Planee actividades al  aire libre cuando las concentraciones de polen estn en su nivel ms bajo. Normalmente, esto es [employer] de noche.  Cuando ingrese al interior, [place] de ropa y dese una ducha antes de  sentarse en los muebles o la cama.  Tome los medicamentos de venta libre y los recetados solamente como se lo haya indicado el mdico.  Oceanographer a todas las visitas de seguimiento como se lo haya indicado el mdico. Esto es importante. Comunquese con un mdico si:  Tiene fiebre.  Tiene tos persistente.  Comienza a emitir un sonido agudo al respirar (sibilancia).  Sus sntomas interfieren con sus actividades diarias normales. Solicite ayuda de inmediato si:  Le falta el aire. Resumen  Esta afeccin puede controlarse al tomar [employer] como se le indique y al Secretary/administrator.  Comunquese con su mdico si tiene tos persistente o fiebre.  Durante la temporada de [place], [place] las ventanas cerradas la mayor cantidad de [place] posible. Esta informacin no tiene Theme park manager el consejo del mdico. Asegrese de hacerle al mdico cualquier pregunta que tenga. Document Released: 03/13/2005 Document Revised: 09/18/2016 Document Reviewed: 09/18/2016 Elsevier Interactive Patient Education  2018 [employer].    IF you received an x-ray today, you will receive an invoice from [facility]. Please contact [facility] at [phone] with questions or concerns regarding your invoice.   IF you received labwork today, you will receive an invoice from [place]. Please contact [facility] at [phone] with questions or concerns regarding your invoice.   Our billing staff will not be able to assist you with questions regarding bills from these companies.  You will be contacted with the lab results as soon as they are available. The fastest way to get your results is to activate your My Chart account. Instructions are located on the last page of this paperwork. If you have not heard from [place] regarding the results in 2 weeks, please contact this office.

## 2017-10-18 NOTE — Progress Notes (Signed)
Patient ID: Gabriela Cantu, female    DOB: 1990/07/22, 27 y.o.   MRN: [id number]  PCP: Patient, No Pcp Per  Chief Complaint  Patient presents with  . Cough    still having sore throat with pain in ears and throat. Follow up visit from 09/19/17    Subjective:   Presents for evaluation of persistent cough, ear pain and sore throat.  She is accompanied by her husband.  She was evaluated here on 09/19/2017 by 1 of my colleagues.  She was diagnosed with acute bronchitis and treated with azithromycin.  Other than cough all her symptoms resolved but 5 days after completing a azithromycin regimen she developed a sore throat and right ear pain again.  Also experienced laryngitis.  Cough produces sputum.  Yellowish in the mornings but then changes to light throughout the day.  Symptoms feel more significant in the evenings.  Some wheezing noted at night. Decreased appetite.  Intermittent lightheadedness.  Trying to stay well-hydrated.  Several sick contacts at work.  OTC cetirizine, loratadine, pseudoephedrine, Advil sinus all with some relief but she stopped these when she started using Hycodan for cough, concerned that they would interact.  She has had no fevers, chills, nausea, vomiting, abdominal pain, urinary symptoms.    Review of Systems Constitutional: Positive for appetite change, diaphoresis and fatigue. Negative for chills and fever.  HENT: Positive for ear pain, sore throat, trouble swallowing and voice change. Negative for congestion, ear discharge, postnasal drip, rhinorrhea, sinus pressure, sinus pain and tinnitus.   Eyes: Negative for photophobia, pain, discharge, redness, itching and visual disturbance.  Respiratory: Positive for cough and wheezing. Negative for shortness of breath.   Cardiovascular: Negative for chest pain, palpitations and leg swelling.  Gastrointestinal: Negative for abdominal pain, diarrhea, nausea and vomiting.  Endocrine: Negative.   Genitourinary:  Negative for difficulty urinating, dysuria, flank pain, hematuria, pelvic pain and urgency.  Musculoskeletal: Negative.   Skin: Negative.   Allergic/Immunologic: Positive for environmental allergies.  Neurological: Negative for dizziness, weakness and headaches.  Hematological: Negative.   Psychiatric/Behavioral: Negative.        Patient Active Problem List   Diagnosis Date Noted  . Nexplanon in place 02/15/2016  . DUB (dysfunctional uterine bleeding) 09/06/2015  . Hyperglycemia 08/23/2015  . Pseudotumor cerebri 07/14/2012  . Obesity 07/14/2012  . Visual field loss 07/14/2012     Prior to Admission medications   Medication Sig Start Date End Date Taking? Authorizing Provider  benzonatate (TESSALON) 100 MG capsule Take 1-2 capsules (100-200 mg total) by mouth 3 (three) times daily as needed for cough. 09/19/17  Yes McVey, Madelaine Bhat, PA-C  HYDROcodone-homatropine (HYCODAN) 5-1.5 MG/5ML syrup Take 5 mLs by mouth every 8 (eight) hours as needed for cough. 09/19/17  Yes McVey, Madelaine Bhat, PA-C  azithromycin (ZITHROMAX) 250 MG tablet Take 2 tabs PO x 1 dose, then 1 tab PO QD x 4 days Patient not taking: Reported on 10/18/2017 09/19/17   McVey, Madelaine Bhat, PA-C  spironolactone (ALDACTONE) 50 MG tablet Take 1 tablet (50 mg total) by mouth daily. Patient not taking: Reported on 09/19/2017 09/07/15   Harrington Challenger, NP     No Known Allergies     Objective:  Physical Exam  Constitutional: She is oriented to person, place, and time. She appears well-developed and well-nourished. No distress.  BP 130/90 (BP Location: Left Arm, Patient Position: Sitting, Cuff Size: Normal)   Pulse 96   Temp 98.5 F (36.9 C) (Oral)  Ht 5' 3.4" (1.61 m)   Wt (!) 335 lb 6.4 oz (152.1 kg)   SpO2 98%   BMI 58.67 kg/m    HENT:  Head: Normocephalic and atraumatic.  Right Ear: Hearing, tympanic membrane, external ear and ear canal normal.  Left Ear: Hearing, tympanic membrane, external ear  and ear canal normal.  Nose: Mucosal edema and rhinorrhea present.  No foreign bodies. Right sinus exhibits no maxillary sinus tenderness and no frontal sinus tenderness. Left sinus exhibits no maxillary sinus tenderness and no frontal sinus tenderness.  Mouth/Throat: Uvula is midline, oropharynx is clear and moist and mucous membranes are normal. No uvula swelling. No oropharyngeal exudate.  Eyes: Pupils are equal, round, and reactive to light. Conjunctivae and EOM are normal. Right eye exhibits no discharge. Left eye exhibits no discharge. No scleral icterus.  Neck: Trachea normal, normal range of motion and full passive range of motion without pain. Neck supple. No thyroid mass and no thyromegaly present.  Cardiovascular: Normal rate, regular rhythm and normal heart sounds.  Pulmonary/Chest: Effort normal and breath sounds normal.  Lymphadenopathy:       Head (right side): No submandibular, no tonsillar, no preauricular, no posterior auricular and no occipital adenopathy present.       Head (left side): No submandibular, no tonsillar, no preauricular and no occipital adenopathy present.    She has no cervical adenopathy.       Right: No supraclavicular adenopathy present.       Left: No supraclavicular adenopathy present.  Neurological: She is alert and oriented to person, place, and time. She has normal strength. No cranial nerve deficit or sensory deficit.  Skin: Skin is warm, dry and intact. No rash noted.  Psychiatric: She has a normal mood and affect. Her speech is normal and behavior is normal.           Assessment & Plan:   1. Cough 2. Laryngitis 3. Seasonal allergic rhinitis, unspecified trigger I suspect that her current symptoms are allergy related rather than infectious.  Start Flonase for maintenance.  Until it is effective, as a lasting, guaifenesin, OTC loratadine.  Tessalon Perles for daytime use, hydrocodone-containing cough syrup for at bedtime. - benzonatate  (TESSALON) 100 MG capsule; Take 1-2 capsules (100-200 mg total) by mouth 3 (three) times daily as needed for cough.  Dispense: 40 capsule; Refill: 0 - HYDROcodone-homatropine (HYCODAN) 5-1.5 MG/5ML syrup; Take 5 mLs by mouth every 8 (eight) hours as needed for cough.  Dispense: 75 mL; Refill: 0 - Guaifenesin (MUCINEX MAXIMUM STRENGTH) 1200 MG TB12; Take 1 tablet (1,200 mg total) by mouth every 12 (twelve) hours as needed.  Dispense: 14 tablet; Refill: 1 - azelastine (ASTELIN) 0.1 % nasal spray; Place 2 sprays into both nostrils 2 (two) times daily. Use in each nostril as directed  Dispense: 30 mL; Refill: 0 - fluticasone (FLONASE) 50 MCG/ACT nasal spray; Place 2 sprays into both nostrils daily.  Dispense: 16 g; Refill: 12    Return if symptoms worsen or fail to improve, for Annual exam at your convenience.   Fernande Bras, PA-C [facility]

## 2018-05-13 DIAGNOSIS — R51 Headache: Secondary | ICD-10-CM | POA: Diagnosis not present

## 2018-05-15 DIAGNOSIS — R51 Headache: Secondary | ICD-10-CM | POA: Diagnosis not present

## 2018-05-15 DIAGNOSIS — I10 Essential (primary) hypertension: Secondary | ICD-10-CM | POA: Diagnosis not present

## 2018-05-15 DIAGNOSIS — G932 Benign intracranial hypertension: Secondary | ICD-10-CM | POA: Diagnosis not present

## 2018-05-18 ENCOUNTER — Ambulatory Visit
Admission: RE | Admit: 2018-05-18 | Discharge: 2018-05-18 | Disposition: A | Discharge status: Home / Self Care | Payer: BLUE CROSS/BLUE SHIELD | Source: Ambulatory Visit | Attending: Internal Medicine | Admitting: Internal Medicine

## 2018-05-18 ENCOUNTER — Other Ambulatory Visit: Payer: Self-pay | Admitting: Internal Medicine

## 2018-05-18 DIAGNOSIS — Z982 Presence of cerebrospinal fluid drainage device: Secondary | ICD-10-CM | POA: Diagnosis not present

## 2018-05-18 DIAGNOSIS — I10 Essential (primary) hypertension: Secondary | ICD-10-CM | POA: Diagnosis not present

## 2018-05-18 DIAGNOSIS — Z978 Presence of other specified devices: Secondary | ICD-10-CM

## 2018-05-18 DIAGNOSIS — G932 Benign intracranial hypertension: Secondary | ICD-10-CM

## 2018-05-20 DIAGNOSIS — R3121 Asymptomatic microscopic hematuria: Secondary | ICD-10-CM | POA: Diagnosis not present

## 2018-05-20 DIAGNOSIS — E119 Type 2 diabetes mellitus without complications: Secondary | ICD-10-CM | POA: Diagnosis not present

## 2018-05-20 DIAGNOSIS — I10 Essential (primary) hypertension: Secondary | ICD-10-CM | POA: Diagnosis not present

## 2018-05-20 DIAGNOSIS — E1165 Type 2 diabetes mellitus with hyperglycemia: Secondary | ICD-10-CM | POA: Diagnosis not present

## 2018-05-20 DIAGNOSIS — E782 Mixed hyperlipidemia: Secondary | ICD-10-CM | POA: Diagnosis not present

## 2018-05-21 ENCOUNTER — Other Ambulatory Visit: Payer: BLUE CROSS/BLUE SHIELD

## 2018-05-25 ENCOUNTER — Other Ambulatory Visit (HOSPITAL_BASED_OUTPATIENT_CLINIC_OR_DEPARTMENT_OTHER): Payer: Self-pay

## 2018-05-25 DIAGNOSIS — G4733 Obstructive sleep apnea (adult) (pediatric): Secondary | ICD-10-CM

## 2018-06-11 ENCOUNTER — Encounter (HOSPITAL_COMMUNITY): Payer: Self-pay

## 2018-06-11 ENCOUNTER — Emergency Department (HOSPITAL_COMMUNITY)
Admission: EM | Admit: 2018-06-11 | Discharge: 2018-06-11 | Disposition: A | Discharge status: Home / Self Care | Payer: BLUE CROSS/BLUE SHIELD | Attending: Emergency Medicine | Admitting: Emergency Medicine

## 2018-06-11 ENCOUNTER — Other Ambulatory Visit: Payer: Self-pay

## 2018-06-11 ENCOUNTER — Emergency Department (HOSPITAL_COMMUNITY): Payer: BLUE CROSS/BLUE SHIELD

## 2018-06-11 DIAGNOSIS — R112 Nausea with vomiting, unspecified: Secondary | ICD-10-CM | POA: Diagnosis not present

## 2018-06-11 DIAGNOSIS — Z79899 Other long term (current) drug therapy: Secondary | ICD-10-CM | POA: Diagnosis not present

## 2018-06-11 DIAGNOSIS — E119 Type 2 diabetes mellitus without complications: Secondary | ICD-10-CM | POA: Insufficient documentation

## 2018-06-11 DIAGNOSIS — R05 Cough: Secondary | ICD-10-CM | POA: Diagnosis not present

## 2018-06-11 DIAGNOSIS — R509 Fever, unspecified: Secondary | ICD-10-CM | POA: Diagnosis not present

## 2018-06-11 DIAGNOSIS — Z7984 Long term (current) use of oral hypoglycemic drugs: Secondary | ICD-10-CM | POA: Diagnosis not present

## 2018-06-11 DIAGNOSIS — J069 Acute upper respiratory infection, unspecified: Secondary | ICD-10-CM | POA: Diagnosis not present

## 2018-06-11 DIAGNOSIS — B9789 Other viral agents as the cause of diseases classified elsewhere: Secondary | ICD-10-CM | POA: Diagnosis not present

## 2018-06-11 HISTORY — DX: Type 2 diabetes mellitus without complications: E11.9

## 2018-06-11 LAB — URINALYSIS, ROUTINE W REFLEX MICROSCOPIC
BILIRUBIN URINE: NEGATIVE
Glucose, UA: NEGATIVE mg/dL
Ketones, ur: NEGATIVE mg/dL
Leukocytes, UA: NEGATIVE
Nitrite: NEGATIVE
Protein, ur: 100 mg/dL — AB
RBC / HPF: >50 RBC/hpf — ABNORMAL HIGH (ref 0–5)
SPECIFIC GRAVITY, URINE: 1.012 (ref 1.005–1.030)
pH: 6 (ref 5.0–8.0)

## 2018-06-11 LAB — COMPREHENSIVE METABOLIC PANEL
ALT: 82 U/L — ABNORMAL HIGH (ref 0–44)
AST: 57 U/L — ABNORMAL HIGH (ref 15–41)
Albumin: 4.2 g/dL (ref 3.5–5.0)
Alkaline Phosphatase: 60 U/L (ref 38–126)
Anion gap: 11 (ref 5–15)
BUN: 13 mg/dL (ref 6–20)
CO2: 28 mmol/L (ref 22–32)
Calcium: 9.2 mg/dL (ref 8.9–10.3)
Chloride: 100 mmol/L (ref 98–111)
Creatinine, Ser: 0.83 mg/dL (ref 0.44–1.00)
GFR calc non Af Amer: >60 mL/min (ref >60)
Glucose, Bld: 151 mg/dL — ABNORMAL HIGH (ref 70–99)
Potassium: 3.7 mmol/L (ref 3.5–5.1)
Sodium: 139 mmol/L (ref 135–145)
TOTAL PROTEIN: 7.6 g/dL (ref 6.5–8.1)
Total Bilirubin: 0.7 mg/dL (ref 0.3–1.2)

## 2018-06-11 LAB — CBC
HCT: 42.5 % (ref 36.0–46.0)
Hemoglobin: 14.3 g/dL (ref 12.0–15.0)
MCH: 27.8 pg (ref 26.0–34.0)
MCHC: 33.6 g/dL (ref 30.0–36.0)
MCV: 82.7 fL (ref 80.0–100.0)
Platelets: 320 10*3/uL (ref 150–400)
RBC: 5.14 MIL/uL — ABNORMAL HIGH (ref 3.87–5.11)
RDW: 11.7 % (ref 11.5–15.5)
WBC: 6.3 10*3/uL (ref 4.0–10.5)
nRBC: 0 % (ref 0.0–0.2)

## 2018-06-11 LAB — LIPASE, BLOOD: Lipase: 36 U/L (ref 11–51)

## 2018-06-11 LAB — POC URINE PREG, ED: Preg Test, Ur: NEGATIVE

## 2018-06-11 MED ORDER — BENZONATATE 100 MG PO CAPS: 100.0000 mg | ORAL_CAPSULE | Freq: Three times a day (TID) | ORAL | 0 refills | Status: DC

## 2018-06-11 MED ORDER — ONDANSETRON HCL 4 MG PO TABS: 4.0000 mg | ORAL_TABLET | Freq: Three times a day (TID) | ORAL | 0 refills | Status: DC | PRN

## 2018-06-11 MED ORDER — ONDANSETRON 4 MG PO TBDP
4.0000 mg | ORAL_TABLET | Freq: Once | ORAL | Status: AC | PRN
  Administered 2018-06-11: 4 mg via ORAL
  Filled 2018-06-11: qty 1

## 2018-06-11 MED ORDER — SALINE SPRAY 0.65 % NA SOLN: 2.0000 | NASAL | 0 refills | Status: DC | PRN

## 2018-06-11 NOTE — ED Triage Notes (Signed)
Patient reports that she has a productive cough x 4 days, fever 2 days ago, and vomiting 2 days ago.  Patient also reports that she was recently treated for a UTI and states she is seeing brown flecks in her urine like she had when she had a UTI.

## 2018-06-11 NOTE — Discharge Instructions (Addendum)
You have been seen today for cough and vomiting. Please read and follow all provided instructions.   1. Medications: nasal saline for congestion, tessalon for cough, zofran for nausea, usual home medications 2. Treatment: rest, drink plenty of fluids 3. Follow Up: Please follow up with your primary doctor in 2 days for discussion of your diagnoses and further evaluation after today's visit; if you do not have a primary care doctor use the resource guide provided to find one; Please return to the ER for any new or worsening symptoms. Please obtain all of your results from medical records or have your doctors office obtain the results - share them with your doctor - you should be seen at your doctors office. Call today to arrange your follow up.   Take medications as prescribed. Please review all of the medicines and only take them if you do not have an allergy to them. Return to the emergency room for worsening condition or new concerning symptoms. Follow up with your regular doctor. If you don't have a regular doctor use one of the numbers below to establish a primary care doctor.  Please be aware that if you are taking birth control pills, taking other prescriptions, ESPECIALLY ANTIBIOTICS may make the birth control ineffective - if this is the case, either do not engage in sexual activity or use alternative methods of birth control such as condoms until you have finished the medicine and your family doctor says it is OK to restart them. If you are on a blood thinner such as COUMADIN, be aware that any other medicine that you take may cause the coumadin to either work too much, or not enough - you should have your coumadin level rechecked in next 7 days if this is the case.  ?  It is also a possibility that you have an allergic reaction to any of the medicines that you have been prescribed - Everybody reacts differently to medications and while MOST people have no trouble with most medicines, you may have  a reaction such as nausea, vomiting, rash, swelling, shortness of breath. If this is the case, please stop taking the medicine immediately and contact your physician.  ?  You should return to the ER if you develop severe or worsening symptoms.   Emergency Department Resource Guide 1) Find a Doctor and Pay Out of Pocket Although you won't have to find out who is covered by your insurance plan, it is a good idea to ask around and get recommendations. You will then need to call the office and see if the doctor you have chosen will accept you as a new patient and what types of options they offer for patients who are self-pay. Some doctors offer discounts or will set up payment plans for their patients who do not have insurance, but you will need to ask so you aren't surprised when you get to your appointment.  2) Contact Your Local Health Department Not all health departments have doctors that can see patients for sick visits, but many do, so it is worth a call to see if yours does. If you don't know where your local health department is, you can check in your phone book. The CDC also has a tool to help you locate your state's health department, and many state websites also have listings of all of their local health departments.  3) Find a Walk-in Clinic If your illness is not likely to be very severe or complicated, you may want to  try a walk in clinic. These are popping up all over the country in pharmacies, drugstores, and shopping centers. They're usually staffed by nurse practitioners or physician assistants that have been trained to treat common illnesses and complaints. They're usually fairly quick and inexpensive. However, if you have serious medical issues or chronic medical problems, these are probably not your best option.  No Primary Care Doctor: Call Health Connect at  [phone] - they can help you locate a primary care doctor that  accepts your insurance, provides certain services,  etc. Physician Referral [phone]  Emergency Department Resource Guide 1) Find a Doctor and Pay Out of Pocket Although you won't have to find out who is covered by your insurance plan, it is a good idea to ask around and get recommendations. You will then need to call the office and see if the doctor you have chosen will accept you as a new patient and what types of options they offer for patients who are self-pay. Some doctors offer discounts or will set up payment plans for their patients who do not have insurance, but you will need to ask so you aren't surprised when you get to your appointment.  2) Contact Your Local Health Department Not all health departments have doctors that can see patients for sick visits, but many do, so it is worth a call to see if yours does. If you don't know where your local health department is, you can check in your phone book. The CDC also has a tool to help you locate your state's health department, and many state websites also have listings of all of their local health departments.  3) Find a [facility] If your illness is not likely to be very severe or complicated, you may want to try a walk in clinic. These are popping up all over the country in pharmacies, drugstores, and shopping centers. They're usually staffed by nurse practitioners or physician assistants that have been trained to treat common illnesses and complaints. They're usually fairly quick and inexpensive. However, if you have serious medical issues or chronic medical problems, these are probably not your best option.  No Primary Care Doctor: Call Health Connect at  [phone] - they can help you locate a primary care doctor that  accepts your insurance, provides certain services, etc. Physician Referral Service- [phone]  Chronic Pain Problems: Organization         Address  Phone   Notes  [facility]  [phone] Patients need to be referred by their  primary care doctor.   Medication Assistance: Organization         Address  Phone   Notes  [facility] Medication [facility] [address], [place] [address] [phone] --Must be a resident of [facility] -- Must have NO insurance coverage whatsoever (no Medicaid/ Medicare, etc.) -- The pt. MUST have a primary care doctor that directs their care regularly and follows them in the community   [facility]  [phone]   [employer]  [phone]    Agencies that provide inexpensive medical care: Organization         Address  Phone   Notes  [place]  [phone]   [facility]    [phone]   [facility] [place], [place] [address] [phone]   [place] [address] [place] [phone]  Planned Parenthood    [phone]   [facility]    [phone]   [facility] [place], [place] Phone:  [phone], Fax:  [phone] Hours of Operation:  9 am - 6 pm, M-F.  Also accepts Medicaid/Medicare and self-pay.  [facility] [place], [address], [place] Phone: [phone], Fax: [phone]. Hours of Operation:  8:30 am - 5:30 pm, M-F.  Also accepts Medicaid and self-pay.  [facility] [place] [address], [place] Phone: [phone]   [place], [place], [place] [phone], [address]. [phone] Mondays & Thursdays: 7-9 AM.  First 15 patients are seen on a first come, first serve basis.    [place] Providers:  Organization         Address  Phone   Notes  [facility] [address],  [phone] Also accepts self-pay patients.  [facility] [address], [place]  [phone]   [place], [address], [place] [phone]   [facility] [address] [place] [phone]   Lucianne Lei [address], [place]   [phone] Only accepts [place] patients after they have their name applied to their card.   Self-Pay (no insurance) in [facility]:  Organization         Address  Phone   Notes  Sickle Cell Patients, [facility] [place] [phone]   [facility] [place] [phone]   [facility] [place]  [place], [place], [place] [phone]   [facility]/Dr. Osei-Bonsu  [address] [place] or [address], [place] [phone] Phone number for both [place] and [place] locations is the same.  Urgent Medical and [facility] [address], [place] [phone]   [facility] [address] [place] or [address] [phone]   [facility] [address], [place] [phone], phone; [phone], fax Sees patients 1st and 3rd Saturday of every month.  Must not qualify for public or private insurance (i.e. Medicaid, Medicare, [place] [employer] Choice, Veterans' Benefits)  Household income should be no more than 200% of the poverty level The clinic cannot treat you if you are pregnant or think you are pregnant  Sexually transmitted diseases are not treated at the clinic.

## 2018-06-11 NOTE — ED Provider Notes (Signed)
[facility]-EMERGENCY DEPT Provider Note   CSN: [id number] Arrival date & time: 06/11/18  1228     History   Chief Complaint Chief Complaint  Patient presents with  . Cough  . Emesis    HPI Gabriela Cantu is a 27 y.o. female with a PMH of diabetes, morbid obesity, and pseudotumor cerebri with a shunt presents with a productive cough, congestion, and sore throat onset 4 days ago. Patient states she initially had a fever, but fever has resolved. Patient reports nausea and vomiting saliva the last two days with 3 episodes in the last 24 hours. Patient denies vomiting food. Patient states symptoms are worse with eating and nothing makes symptoms better. Patient denies alcohol use. Patient denies abdominal pain or diarrhea. Patient reports sick contacts at work. Patient states she has taken Nyquil with partial relief. Patient states she had a UTI recently and has noticed changes in her urine similar to when she had a UTI. Patient denies dysuria, frequency, or urgency.   HPI  Past Medical History:  Diagnosis Date  . Diabetes mellitus without complication (HCC)   . Morbid obesity (HCC)   . Pseudotumor cerebri     Patient Active Problem List   Diagnosis Date Noted  . Nexplanon in place 02/15/2016  . DUB (dysfunctional uterine bleeding) 09/06/2015  . Hyperglycemia 08/23/2015  . Pseudotumor cerebri 07/14/2012  . Obesity 07/14/2012  . Visual field loss 07/14/2012    Past Surgical History:  Procedure Laterality Date  . LAPAROSCOPIC REVISION VENTRICULAR-PERITONEAL (V-P) SHUNT Left 07/28/2012   Procedure: LAPAROSCOPIC REVISION VENTRICULAR-PERITONEAL (V-P) SHUNT;  Surgeon: Adolph Pollack, MD;  Location: [facility];  Service: General;  Laterality: Left;  Ventricular-peritoneal shunt placement with Dr. Abbey Chatters  . VENTRICULOPERITONEAL SHUNT N/A 07/28/2012   Procedure: SHUNT INSERTION VENTRICULAR-PERITONEAL;  Surgeon: Maeola Harman, MD;  Location: [facility];   Service: Neurosurgery;  Laterality: N/A;  Ventricular-peritoneal shunt placement with Dr. Abbey Chatters     OB History    Gravida  0   Para  0   Term  0   Preterm  0   AB  0   Living  0     SAB  0   TAB  0   Ectopic  0   Multiple  0   Live Births               Home Medications    Prior to Admission medications   Medication Sig Start Date End Date Taking? Authorizing Provider  atorvastatin (LIPITOR) 20 MG tablet Take 20 mg by mouth every evening.   Yes [provider]  fluticasone (FLONASE) 50 MCG/ACT nasal spray Place 2 sprays into both nostrils daily. Patient taking differently: Place 2 sprays into both nostrils daily as needed for allergies.  10/18/17  Yes Jeffery, Chelle, PA  HYDROcodone-homatropine (HYCODAN) 5-1.5 MG/5ML syrup Take 5 mLs by mouth every 8 (eight) hours as needed for cough. 10/18/17  Yes Jeffery, Chelle, PA  Insulin Glargine (LANTUS SOLOSTAR) 100 UNIT/ML Solostar Pen Inject 10 Units into the skin at bedtime.   Yes [provider]  lisinopril-hydrochlorothiazide (PRINZIDE,ZESTORETIC) 10-12.5 MG tablet Take 1 tablet by mouth daily. 05/15/18  Yes [provider]  metFORMIN (GLUCOPHAGE) 500 MG tablet Take 500 mg by mouth 2 (two) times daily.   Yes [provider]  benzonatate (TESSALON) 100 MG capsule Take 1 capsule (100 mg total) by mouth every 8 (eight) hours. 06/11/18   Carlyle Basques P, PA-C  ondansetron [facility]) 4  MG tablet Take 1 tablet (4 mg total) by mouth every 8 (eight) hours as needed for nausea or vomiting. 06/11/18   Carlyle BasquesHernandez, Exie Chrismer P, PA-C  sodium chloride (OCEAN) 0.65 % SOLN nasal spray Place 2 sprays into both nostrils as needed for congestion. 06/11/18   Leretha DykesHernandez, Chelsae Zanella P, PA-C    Family History Family History  Problem Relation Age of Onset  . Hypertension Mother   . Kidney disease Mother   . Hypertension Maternal Aunt   . Hypertension Maternal Grandmother   . Hypertension Maternal Grandfather      Social History Social History   Tobacco Use  . Smoking status: Never Smoker  . Smokeless tobacco: Never Used  Substance Use Topics  . Alcohol use: No    Alcohol/week: 0.0 standard drinks  . Drug use: No     Allergies   Patient has no known allergies.   Review of Systems Review of Systems  Constitutional: Positive for fever. Negative for activity change, appetite change, chills and unexpected weight change.  HENT: Positive for congestion, rhinorrhea and sore throat. Negative for trouble swallowing.   Eyes: Negative for visual disturbance.  Respiratory: Positive for cough. Negative for shortness of breath.   Cardiovascular: Negative for chest pain.  Gastrointestinal: Positive for nausea and vomiting. Negative for abdominal pain, constipation and diarrhea.  Endocrine: Negative for polydipsia, polyphagia and polyuria.  Genitourinary: Negative for dysuria, flank pain and frequency.  Musculoskeletal: Negative for back pain.  Skin: Negative for rash.  Allergic/Immunologic: Negative for immunocompromised state.  Neurological: Negative for headaches.  Psychiatric/Behavioral: The patient is not nervous/anxious.      Physical Exam Updated Vital Signs BP (!) 148/90 (BP Location: Right Arm)   Pulse (!) 101   Temp 98.7 F (37.1 C) (Oral)   Resp 18   Ht 5\' 3"  (1.6 m)   Wt (!) 140.2 kg   SpO2 99%   BMI 54.74 kg/m   Physical Exam Vitals signs and nursing note reviewed.  Constitutional:      General: She is not in acute distress.    Appearance: She is well-developed. She is not diaphoretic.  HENT:     Head: Normocephalic and atraumatic.     Right Ear: Tympanic membrane, ear canal and external ear normal.     Left Ear: Tympanic membrane, ear canal and external ear normal.     Nose: Congestion and rhinorrhea present.     Mouth/Throat:     Mouth: Mucous membranes are moist.     Pharynx: Posterior oropharyngeal erythema present. No oropharyngeal exudate.  Eyes:      Conjunctiva/sclera: Conjunctivae normal.  Neck:     Musculoskeletal: Normal range of motion and neck supple.  Cardiovascular:     Rate and Rhythm: Normal rate and regular rhythm.     Heart sounds: Normal heart sounds. No murmur. No friction rub. No gallop.   Pulmonary:     Effort: Pulmonary effort is normal. No respiratory distress.     Breath sounds: Normal breath sounds. No wheezing or rales.  Abdominal:     Palpations: Abdomen is soft.     Tenderness: There is abdominal tenderness (Mild epigastric soreness noted on physical exam. ) in the epigastric area. There is no guarding or rebound.  Musculoskeletal: Normal range of motion.  Skin:    General: Skin is warm.     Findings: No erythema or rash.  Neurological:     Mental Status: She is alert and oriented to person, place, and time.  Psychiatric:  Mood and Affect: Mood is anxious.      ED Treatments / Results  Labs (all labs ordered are listed, but only abnormal results are displayed) Labs Reviewed  COMPREHENSIVE METABOLIC PANEL - Abnormal; Notable for the following components:      Result Value   Glucose, Bld 151 (*)    AST 57 (*)    ALT 82 (*)    All other components within normal limits  CBC - Abnormal; Notable for the following components:   RBC 5.14 (*)    All other components within normal limits  URINALYSIS, ROUTINE W REFLEX MICROSCOPIC - Abnormal; Notable for the following components:   Hgb urine dipstick LARGE (*)    Protein, ur 100 (*)    RBC / HPF >50 (*)    Bacteria, UA RARE (*)    All other components within normal limits  URINE CULTURE  LIPASE, BLOOD  POC URINE PREG, ED    EKG None  Radiology Dg Chest 2 View  Result Date: 06/11/2018 CLINICAL DATA:  Productive cough for 4 days, fever recently, some vomiting EXAM: CHEST - 2 VIEW COMPARISON:  Portable chest x-ray of 05/18/2018 FINDINGS: No pneumonia or pleural effusion is seen. Mediastinal and hilar contours are unremarkable. The heart is  within normal limits in size. No acute bony abnormality is seen. There appears to be a VP shunt present. IMPRESSION: No active cardiopulmonary disease. Electronically Signed   By: Dwyane DeePaul  Barry M.D.   On: 06/11/2018 14:58    Procedures Procedures (including critical care time)  Medications Ordered in ED Medications  ondansetron (ZOFRAN-ODT) disintegrating tablet 4 mg (4 mg Oral Given 06/11/18 1247)     Initial Impression / Assessment and Plan / ED Course  I have reviewed the triage vital signs and the nursing notes.  Pertinent labs & imaging results that were available during my care of the patient were reviewed by me and considered in my medical decision making (see chart for details).  Clinical Course as of Jun 11 1548  Thu Jun 11, 2018  1449 Elevated RBCs noted on UA. Will order urine culture. Patient denies any urinary symptoms. Instructed patient to follow up with PCP regarding further evaluation of hematuria.   RBC / HPF(!): >50 [AH]  1453 Suspect elevated liver enzymes are likely due to viral infection.   AST(!): 57 [AH]  1519 CXR reveals no active cardiopulmonary disease.  DG Chest 2 View [AH]    Clinical Course User Index [AH] Leretha DykesHernandez, Tracie Lindbloom P, PA-C   Patient is nontoxic, nonseptic appearing, in no apparent distress.  Patient's pain and other symptoms adequately managed in emergency department.  Suspect symptoms are likely viral in nature due to history, physical exam, and work up. Labs, imaging and vitals reviewed.  Instructed patient to follow up with PCP regarding hematuria. Patient does not meet the SIRS or Sepsis criteria.  On exam patient does not have a surgical abdomin and there are no peritoneal signs.  No indication of pregnancy, pneumonia, pancreatitis, appendicitis, bowel obstruction, bowel perforation, cholecystitis, or diverticulitis.  Patient discharged home with symptomatic treatment and given strict instructions for follow-up with their primary care physician.  I  have also discussed reasons to return immediately to the ER.  Patient expresses understanding and agrees with plan.   Final Clinical Impressions(s) / ED Diagnoses   Final diagnoses:  Viral URI with cough    ED Discharge Orders         Ordered    ondansetron (ZOFRAN) 4 MG  tablet  Every 8 hours PRN     06/11/18 1548    benzonatate (TESSALON) 100 MG capsule  Every 8 hours     06/11/18 1548    sodium chloride (OCEAN) 0.65 % SOLN nasal spray  As needed     06/11/18 1548           Leretha Dykes, [place] 06/11/18 1549    Mesner, Barbara Cower, MD 06/11/18 1646

## 2018-06-11 NOTE — ED Provider Notes (Signed)
Medical screening examination/treatment/procedure(s) were conducted as a shared visit with non-physician practitioner(s) and myself.  I personally evaluated the patient during the encounter.  27 year old female presents the emergency department today with cough, vomiting phlegm for the last few days.  She is found that she had a frontal sinus pain a few days ago with some intermittent fevers and persistent drainage and had a multiple episodes of postnasal drip leading to coughing and vomiting clear phlegm.  No more fevers today.  She does have a nausea but otherwise nothing else is improved.  She is tolerating p.o. without persistent vomiting but still feels nauseous.  My exam she has swollen nasal turbinates with significant erythema and mucus.  She has posterior oropharyngeal erythema without swelling or evidence of abscess/tonsillitis.  Her lungs are clear.  Heart is normal. SPECT she likely has a sinusitis of some sort does not sound bacterial at this point.  We will get a x-ray just because of the coughing for for 5 days with fevers make sure she does have a pneumonia.  Would suggest symptomatic treatment this point with PCP follow-up and the symptoms are improving at about the 8 to 10-day mark. No indication for antibiotics currently.      Marily MemosMesner, Sarath Privott, MD 06/11/18 [phone]

## 2018-06-11 NOTE — ED Notes (Signed)
Lab called to add on urine culture.  ?

## 2018-06-12 LAB — URINE CULTURE: Culture: NO GROWTH

## 2018-06-24 ENCOUNTER — Ambulatory Visit (HOSPITAL_BASED_OUTPATIENT_CLINIC_OR_DEPARTMENT_OTHER): Payer: BLUE CROSS/BLUE SHIELD | Attending: Internal Medicine | Admitting: Internal Medicine

## 2018-06-24 DIAGNOSIS — E119 Type 2 diabetes mellitus without complications: Secondary | ICD-10-CM | POA: Diagnosis not present

## 2018-06-24 DIAGNOSIS — I10 Essential (primary) hypertension: Secondary | ICD-10-CM | POA: Diagnosis not present

## 2018-06-24 DIAGNOSIS — G4733 Obstructive sleep apnea (adult) (pediatric): Secondary | ICD-10-CM | POA: Insufficient documentation

## 2018-06-24 DIAGNOSIS — E782 Mixed hyperlipidemia: Secondary | ICD-10-CM | POA: Diagnosis not present

## 2018-06-24 DIAGNOSIS — E1165 Type 2 diabetes mellitus with hyperglycemia: Secondary | ICD-10-CM | POA: Diagnosis not present

## 2018-06-28 DIAGNOSIS — G4733 Obstructive sleep apnea (adult) (pediatric): Secondary | ICD-10-CM | POA: Diagnosis not present

## 2018-06-28 NOTE — Procedures (Signed)
   Patient Name: Gabriela Cantu, Gabriela Cantu Study Date: 06/24/2018 Gender: Female D.O.B: May 18, 1991 Age (years): 28 Referring Provider: Jamison Oka MD Height (inches): 63 Interpreting Physician: Jetty Duhamel MD, ABSM Weight (lbs): 316 RPSGT: Celene Kras BMI: 56 MRN: [id number] Neck Size: 15.50  CLINICAL INFORMATION Sleep Study Type: HST Indication for sleep study: OSA Epworth Sleepiness Score: 8  SLEEP STUDY TECHNIQUE A multi-channel overnight portable sleep study was performed. The channels recorded were: nasal airflow, thoracic respiratory movement, and oxygen saturation with a pulse oximetry. Snoring was also monitored.  MEDICATIONS Patient self administered medications include: none reported.  SLEEP ARCHITECTURE Patient was studied for 384.5 minutes. The sleep efficiency was 99.1 % and the patient was supine for 93.1%. The arousal index was 0.0 per hour.  RESPIRATORY PARAMETERS The overall AHI was 32.6 per hour, with a central apnea index of 0.0 per hour. The oxygen nadir was 76% during sleep.  CARDIAC DATA Mean heart rate during sleep was 87.4 bpm.  IMPRESSIONS - Severe obstructive sleep apnea occurred during this study (AHI = 32.6/h). - No significant central sleep apnea occurred during this study (CAI = 0.0/h). - Oxygen desaturation was noted during this study (Min O2 = 76%). Mean 93%. - Patient snored  DIAGNOSIS - Obstructive Sleep Apnea (327.23 [G47.33 ICD-10])  RECOMMENDATIONS - Suggest CPAP titration sleep study or DME autopap. Other options may include a fitted oral appliance, sleep medical consultation or ENT evaluation, based on clinical judgment. - Be careful with alcohol, sedatives and other CNS depressants that may worsen sleep apnea and disrupt normal sleep architecture. - Sleep hygiene should be reviewed to assess factors that may improve sleep quality. - Weight management and regular exercise should be initiated or continued.  [Electronically signed]  06/28/2018 11:54 AM  Jetty Duhamel MD, ABSM Diplomate, American Board of Sleep Medicine   NPI: [id number]                          Jetty Duhamel Diplomate, American Board of Sleep Medicine  ELECTRONICALLY SIGNED ON:  06/28/2018, 11:49 AM [facility] PH: [phone]   FX: [phone] ACCREDITED BY THE AMERICAN ACADEMY OF SLEEP MEDICINE

## 2018-07-16 DIAGNOSIS — E782 Mixed hyperlipidemia: Secondary | ICD-10-CM | POA: Diagnosis not present

## 2018-07-16 DIAGNOSIS — I10 Essential (primary) hypertension: Secondary | ICD-10-CM | POA: Diagnosis not present

## 2018-07-16 DIAGNOSIS — G4733 Obstructive sleep apnea (adult) (pediatric): Secondary | ICD-10-CM | POA: Diagnosis not present

## 2018-07-16 DIAGNOSIS — E1165 Type 2 diabetes mellitus with hyperglycemia: Secondary | ICD-10-CM | POA: Diagnosis not present

## 2018-07-24 ENCOUNTER — Encounter: Payer: BLUE CROSS/BLUE SHIELD | Attending: Internal Medicine | Admitting: Registered"

## 2018-07-24 ENCOUNTER — Encounter: Payer: Self-pay | Admitting: Registered"

## 2018-07-24 DIAGNOSIS — E119 Type 2 diabetes mellitus without complications: Secondary | ICD-10-CM | POA: Diagnosis not present

## 2018-07-24 NOTE — Progress Notes (Signed)
Diabetes Self-Management Education  Visit Type:  First/Initial  Appt. Start Time: 10:00 Appt. End Time: 11:05  07/24/2018  [name], identified by name and date of birth, is a 28 y.o. female with a diagnosis of Diabetes: Type 2.   ASSESSMENT  Pt expectations: food options  Pt arrives stating that she does not like biking but loves dancing for physical activity. Pt states she does not get hungry or thirsty much during the day. Pt states she is partially [place] and loves rice. States she does not know what to do without carbohydrates. Pt states she does not eat after 8pm, goes to bed earlier to prevent getting hungry. Pt states she typically eats: breakfast (7-7:30am), snack (11am), lunch (2:30pm), and dinner (7-8pm).    There were no vitals taken for this visit. There is no height or weight on file to calculate BMI.   Diabetes Self-Management Education - 07/24/18 1004      Health Coping   How would you rate your overall health?  Fair      Psychosocial Assessment   Patient Belief/Attitude about Diabetes  Afraid    Self-care barriers  None    Patient Concerns  Nutrition/Meal planning    Special Needs  None    Preferred Learning Style  No preference indicated    Learning Readiness  Ready      Complications   Last HgB A1C per patient/outside source  12.6 %    How often do you check your blood sugar?  1-2 times/day    Fasting Blood glucose range (mg/dL)  19-509;326-712   [phone]   Postprandial Blood glucose range (mg/dL)  833-825   [phone]   Number of hypoglycemic episodes per month  1    Can you tell when your blood sugar is low?  Yes    What do you do if your blood sugar is low?  drinks orange juice    Number of hyperglycemic episodes per week  0    Have you had a dilated eye exam in the past 12 months?  Yes    Have you had a dental exam in the past 12 months?  Yes    Are you checking your feet?  Yes    How many days per week are you checking your feet?  1      Dietary Intake   Breakfast  wheat bread sandwich (40 cals/slice) + [place] ham + cheese (sometimes) + milk    Snack (morning)  Glucerna    Lunch  rice + 3 oz meat or salad + boiled plantain + meat + lentil pasta + ground [place] or wheat bread sandwich + baked chips + sparkling water    Snack (afternoon)  none    Dinner  meat + mashed potatoes or salad    Snack (evening)  none    Beverage(s)  water (3 bottles/day), coffee (withy sugar), sparkling water, less fat Lactaid       Exercise   Exercise Type  Moderate (swimming / aerobic walking)   stationary exercise   How many days per week to you exercise?  4    How many minutes per day do you exercise?  15    Total minutes per week of exercise  60      Patient Education   Previous Diabetes Education  No    Disease state   Definition of diabetes, type 1 and 2, and the diagnosis of diabetes;Factors that contribute to the development of diabetes  Nutrition management   Role of diet in the treatment of diabetes and the relationship between the three main macronutrients and blood glucose level;Food label reading, portion sizes and measuring food.;Carbohydrate counting;Reviewed blood glucose goals for pre and post meals and how to evaluate the patients' food intake on their blood glucose level.;Information on hints to eating out and maintain blood glucose control.;Effects of alcohol on blood glucose and safety factors with consumption of alcohol.    Physical activity and exercise   Role of exercise on diabetes management, blood pressure control and cardiac health.    Monitoring  Purpose and frequency of SMBG.;Identified appropriate SMBG and/or A1C goals.;Interpreting lab values - A1C, lipid, urine microalbumina.;Daily foot exams;Yearly dilated eye exam;Taught/discussed recording of test results and interpretation of SMBG.    Acute complications  Taught treatment of hypoglycemia - the 15 rule.;Discussed and identified patients' treatment of hyperglycemia.     Chronic complications  Assessed and discussed foot care and prevention of foot problems;Lipid levels, blood glucose control and heart disease;Relationship between chronic complications and blood glucose control;Reviewed with patient heart disease, higher risk of, and prevention;Identified and discussed with patient  current chronic complications    Psychosocial adjustment  Role of stress on diabetes;Identified and addressed patients feelings and concerns about diabetes    Personal strategies to promote health  Lifestyle issues that need to be addressed for better diabetes care      Individualized Goals (developed by patient)   Nutrition  Follow meal plan discussed;General guidelines for healthy choices and portions discussed    Physical Activity  Exercise 3-5 times per week;30 minutes per day    Medications  take my medication as prescribed    Monitoring   test my blood glucose as discussed;test blood glucose pre and post meals as discussed    Reducing Risk  examine blood glucose patterns;treat hypoglycemia with 15 grams of carbs if blood glucose less than 70mg /dL;increase portions of olive oil in diet;increase portions of nuts and seeds;do foot checks daily      Post-Education Assessment   Patient understands the diabetes disease and treatment process.  Demonstrates understanding / competency    Patient understands incorporating nutritional management into lifestyle.  Demonstrates understanding / competency    Patient undertands incorporating physical activity into lifestyle.  Demonstrates understanding / competency    Patient understands using medications safely.  Demonstrates understanding / competency    Patient understands monitoring blood glucose, interpreting and using results  Demonstrates understanding / competency    Patient understands prevention, detection, and treatment of acute complications.  Demonstrates understanding / competency    Patient understands prevention, detection,  and treatment of chronic complications.  Demonstrates understanding / competency    Patient understands how to develop strategies to address psychosocial issues.  Demonstrates understanding / competency    Patient understands how to develop strategies to promote health/change behavior.  Demonstrates understanding / competency      Outcomes   Program Status  Completed       Learning Objective:  Patient will have a greater understanding of diabetes self-management. Patient education plan is to attend individual and/or group sessions per assessed needs and concerns.   Plan:   Patient Instructions  Goals:  Follow Diabetes Meal Plan as instructed  Eat 3 meals and 2 snacks, every 3-5 hrs  Limit carbohydrate intake to 30-45 grams carbohydrate/meal  Limit carbohydrate intake to 15-30 grams carbohydrate/snack  Add lean protein foods to meals/snacks  Monitor glucose levels as instructed by your doctor  Aim for 30 mins of physical activity daily  Bring food record and glucose log to your next nutrition visit     Expected Outcomes:  Demonstrated interest in learning. Expect positive outcomes  Education material provided: ADA Diabetes: Your Take Control Guide and Carbohydrate counting sheet  If problems or questions, patient to contact team via:  Phone and Email  Future DSME appointment: - Yearly

## 2018-07-24 NOTE — Patient Instructions (Signed)
Goals:  Follow Diabetes Meal Plan as instructed  Eat 3 meals and 2 snacks, every 3-5 hrs  Limit carbohydrate intake to 30-45 grams carbohydrate/meal  Limit carbohydrate intake to 15-30 grams carbohydrate/snack  Add lean protein foods to meals/snacks  Monitor glucose levels as instructed by your doctor  Aim for 30 mins of physical activity daily  Bring food record and glucose log to your next nutrition visit  

## 2018-08-24 DIAGNOSIS — E1165 Type 2 diabetes mellitus with hyperglycemia: Secondary | ICD-10-CM | POA: Diagnosis not present

## 2018-08-24 DIAGNOSIS — E782 Mixed hyperlipidemia: Secondary | ICD-10-CM | POA: Diagnosis not present

## 2018-08-24 DIAGNOSIS — Z0000 Encounter for general adult medical examination without abnormal findings: Secondary | ICD-10-CM | POA: Diagnosis not present

## 2018-08-31 DIAGNOSIS — Z23 Encounter for immunization: Secondary | ICD-10-CM | POA: Diagnosis not present

## 2018-08-31 DIAGNOSIS — E782 Mixed hyperlipidemia: Secondary | ICD-10-CM | POA: Diagnosis not present

## 2018-08-31 DIAGNOSIS — Z01419 Encounter for gynecological examination (general) (routine) without abnormal findings: Secondary | ICD-10-CM | POA: Diagnosis not present

## 2018-08-31 DIAGNOSIS — J302 Other seasonal allergic rhinitis: Secondary | ICD-10-CM | POA: Diagnosis not present

## 2018-08-31 DIAGNOSIS — E1165 Type 2 diabetes mellitus with hyperglycemia: Secondary | ICD-10-CM | POA: Diagnosis not present

## 2018-08-31 DIAGNOSIS — Z0001 Encounter for general adult medical examination with abnormal findings: Secondary | ICD-10-CM | POA: Diagnosis not present

## 2018-09-01 DIAGNOSIS — H52203 Unspecified astigmatism, bilateral: Secondary | ICD-10-CM | POA: Diagnosis not present

## 2018-09-01 DIAGNOSIS — H5340 Unspecified visual field defects: Secondary | ICD-10-CM | POA: Diagnosis not present

## 2018-09-01 DIAGNOSIS — H4720 Unspecified optic atrophy: Secondary | ICD-10-CM | POA: Diagnosis not present

## 2018-09-01 DIAGNOSIS — E119 Type 2 diabetes mellitus without complications: Secondary | ICD-10-CM | POA: Diagnosis not present

## 2018-12-11 DIAGNOSIS — E1165 Type 2 diabetes mellitus with hyperglycemia: Secondary | ICD-10-CM | POA: Diagnosis not present

## 2018-12-11 DIAGNOSIS — E782 Mixed hyperlipidemia: Secondary | ICD-10-CM | POA: Diagnosis not present

## 2018-12-11 DIAGNOSIS — G4733 Obstructive sleep apnea (adult) (pediatric): Secondary | ICD-10-CM | POA: Diagnosis not present

## 2018-12-11 DIAGNOSIS — I10 Essential (primary) hypertension: Secondary | ICD-10-CM | POA: Diagnosis not present

## 2019-01-29 ENCOUNTER — Other Ambulatory Visit: Payer: Self-pay

## 2019-02-01 ENCOUNTER — Other Ambulatory Visit: Payer: Self-pay

## 2019-02-01 ENCOUNTER — Encounter: Payer: Self-pay | Admitting: Women's Health

## 2019-02-01 ENCOUNTER — Ambulatory Visit (INDEPENDENT_AMBULATORY_CARE_PROVIDER_SITE_OTHER): Payer: BC Managed Care – PPO | Admitting: Women's Health

## 2019-02-01 VITALS — BP 130/80 | Ht 63.0 in | Wt 307.0 lb

## 2019-02-01 DIAGNOSIS — Z01419 Encounter for gynecological examination (general) (routine) without abnormal findings: Secondary | ICD-10-CM | POA: Diagnosis not present

## 2019-02-01 DIAGNOSIS — Z20822 Contact with and (suspected) exposure to covid-19: Secondary | ICD-10-CM

## 2019-02-01 NOTE — Patient Instructions (Signed)
Carbohydrate Counting for Diabetes Mellitus, Adult  Carbohydrate counting is a method of keeping track of how many carbohydrates you eat. Eating carbohydrates naturally increases the amount of sugar (glucose) in the blood. Counting how many carbohydrates you eat helps keep your blood glucose within normal limits, which helps you manage your diabetes (diabetes mellitus). It is important to know how many carbohydrates you can safely have in each meal. This is different for every person. A diet and nutrition specialist (registered dietitian) can help you make a meal plan and calculate how many carbohydrates you should have at each meal and snack. Carbohydrates are found in the following foods:  Grains, such as breads and cereals.  Dried beans and soy products.  Starchy vegetables, such as potatoes, peas, and corn.  Fruit and fruit juices.  Milk and yogurt.  Sweets and snack foods, such as cake, cookies, candy, chips, and soft drinks. How do I count carbohydrates? There are two ways to count carbohydrates in food. You can use either of the methods or a combination of both. Reading "Nutrition Facts" on packaged food The "Nutrition Facts" list is included on the labels of almost all packaged foods and beverages in the [place] It includes:  The serving size.  Information about nutrients in each serving, including the grams (g) of carbohydrate per serving. To use the "Nutrition Facts":  Decide how many servings you will have.  Multiply the number of servings by the number of carbohydrates per serving.  The resulting number is the total amount of carbohydrates that you will be having. Learning standard serving sizes of other foods When you eat carbohydrate foods that are not packaged or do not include "Nutrition Facts" on the label, you need to measure the servings in order to count the amount of carbohydrates:  Measure the foods that you will eat with a food scale or measuring cup, if needed.   Decide how many standard-size servings you will eat.  Multiply the number of servings by 15. Most carbohydrate-rich foods have about 15 g of carbohydrates per serving. ? For example, if you eat 8 oz (170 g) of strawberries, you will have eaten 2 servings and 30 g of carbohydrates (2 servings x 15 g = 30 g).  For foods that have more than one food mixed, such as soups and casseroles, you must count the carbohydrates in each food that is included. The following list contains standard serving sizes of common carbohydrate-rich foods. Each of these servings has about 15 g of carbohydrates:   hamburger bun or  English muffin.   oz (15 mL) syrup.   oz (14 g) jelly.  1 slice of bread.  1 six-inch tortilla.  3 oz (85 g) cooked rice or pasta.  4 oz (113 g) cooked dried beans.  4 oz (113 g) starchy vegetable, such as peas, corn, or potatoes.  4 oz (113 g) hot cereal.  4 oz (113 g) mashed potatoes or  of a large baked potato.  4 oz (113 g) canned or frozen fruit.  4 oz (120 mL) fruit juice.  4-6 crackers.  6 chicken nuggets.  6 oz (170 g) unsweetened dry cereal.  6 oz (170 g) plain fat-free yogurt or yogurt sweetened with artificial sweeteners.  8 oz (240 mL) milk.  8 oz (170 g) fresh fruit or one small piece of fruit.  24 oz (680 g) popped popcorn. Example of carbohydrate counting Sample meal  3 oz (85 g) chicken breast.  6 oz (170 g)   brown rice.  4 oz (113 g) corn.  8 oz (240 mL) milk.  8 oz (170 g) strawberries with sugar-free whipped topping. Carbohydrate calculation 1. Identify the foods that contain carbohydrates: ? Rice. ? Corn. ? Milk. ? Strawberries. 2. Calculate how many servings you have of each food: ? 2 servings rice. ? 1 serving corn. ? 1 serving milk. ? 1 serving strawberries. 3. Multiply each number of servings by 15 g: ? 2 servings rice x 15 g = 30 g. ? 1 serving corn x 15 g = 15 g. ? 1 serving milk x 15 g = 15 g. ? 1 serving  strawberries x 15 g = 15 g. 4. Add together all of the amounts to find the total grams of carbohydrates eaten: ? 30 g + 15 g + 15 g + 15 g = 75 g of carbohydrates total. Summary  Carbohydrate counting is a method of keeping track of how many carbohydrates you eat.  Eating carbohydrates naturally increases the amount of sugar (glucose) in the blood.  Counting how many carbohydrates you eat helps keep your blood glucose within normal limits, which helps you manage your diabetes.  A diet and nutrition specialist (registered dietitian) can help you make a meal plan and calculate how many carbohydrates you should have at each meal and snack. This information is not intended to replace advice given to you by your health care provider. Make sure you discuss any questions you have with your health care provider. Document Released: 06/03/2005 Document Revised: 12/26/2016 Document Reviewed: 11/15/2015 Elsevier Patient Education  2020 [employer]. Health Maintenance, Female Adopting a healthy lifestyle and getting preventive care are important in promoting health and wellness. Ask your health care provider about:  The right schedule for you to have regular tests and exams.  Things you can do on your own to prevent diseases and keep yourself healthy. What should I know about diet, weight, and exercise? Eat a healthy diet   Eat a diet that includes plenty of vegetables, fruits, low-fat dairy products, and lean protein.  Do not eat a lot of foods that are high in solid fats, added sugars, or sodium. Maintain a healthy weight Body mass index (BMI) is used to identify weight problems. It estimates body fat based on height and weight. Your health care provider can help determine your BMI and help you achieve or maintain a healthy weight. Get regular exercise Get regular exercise. This is one of the most important things you can do for your health. Most adults should:  Exercise for at least 150  minutes each week. The exercise should increase your heart rate and make you sweat (moderate-intensity exercise).  Do strengthening exercises at least twice a week. This is in addition to the moderate-intensity exercise.  Spend less time sitting. Even light physical activity can be beneficial. Watch cholesterol and blood lipids Have your blood tested for lipids and cholesterol at 27 years of age, then have this test every 5 years. Have your cholesterol levels checked more often if:  Your lipid or cholesterol levels are high.  You are older than 27 years of age.  You are at high risk for heart disease. What should I know about cancer screening? Depending on your health history and family history, you may need to have cancer screening at various ages. This may include screening for:  Breast cancer.  Cervical cancer.  Colorectal cancer.  Skin cancer.  Lung cancer. What should I know about heart disease, diabetes,   and high blood pressure? Blood pressure and heart disease  High blood pressure causes heart disease and increases the risk of stroke. This is more likely to develop in people who have high blood pressure readings, are of African descent, or are overweight.  Have your blood pressure checked: ? Every 3-5 years if you are 18-39 years of age. ? Every year if you are 40 years old or older. Diabetes Have regular diabetes screenings. This checks your fasting blood sugar level. Have the screening done:  Once every three years after age 40 if you are at a normal weight and have a low risk for diabetes.  More often and at a younger age if you are overweight or have a high risk for diabetes. What should I know about preventing infection? Hepatitis B If you have a higher risk for hepatitis B, you should be screened for this virus. Talk with your health care provider to find out if you are at risk for hepatitis B infection. Hepatitis C Testing is recommended for:  Everyone born  from 1945 through 1965.  Anyone with known risk factors for hepatitis C. Sexually transmitted infections (STIs)  Get screened for STIs, including gonorrhea and chlamydia, if: ? You are sexually active and are younger than 27 years of age. ? You are older than 27 years of age and your health care provider tells you that you are at risk for this type of infection. ? Your sexual activity has changed since you were last screened, and you are at increased risk for chlamydia or gonorrhea. Ask your health care provider if you are at risk.  Ask your health care provider about whether you are at high risk for HIV. Your health care provider may recommend a prescription medicine to help prevent HIV infection. If you choose to take medicine to prevent HIV, you should first get tested for HIV. You should then be tested every 3 months for as long as you are taking the medicine. Pregnancy  If you are about to stop having your period (premenopausal) and you may become pregnant, seek counseling before you get pregnant.  Take 400 to 800 micrograms (mcg) of folic acid every day if you become pregnant.  Ask for birth control (contraception) if you want to prevent pregnancy. Osteoporosis and menopause Osteoporosis is a disease in which the bones lose minerals and strength with aging. This can result in bone fractures. If you are 65 years old or older, or if you are at risk for osteoporosis and fractures, ask your health care provider if you should:  Be screened for bone loss.  Take a calcium or vitamin D supplement to lower your risk of fractures.  Be given hormone replacement therapy (HRT) to treat symptoms of menopause. Follow these instructions at home: Lifestyle  Do not use any products that contain nicotine or tobacco, such as cigarettes, e-cigarettes, and chewing tobacco. If you need help quitting, ask your health care provider.  Do not use street drugs.  Do not share needles.  Ask your health  care provider for help if you need support or information about quitting drugs. Alcohol use  Do not drink alcohol if: ? Your health care provider tells you not to drink. ? You are pregnant, may be pregnant, or are planning to become pregnant.  If you drink alcohol: ? Limit how much you use to 0-1 drink a day. ? Limit intake if you are breastfeeding.  Be aware of how much alcohol is in your drink.   In the [place] one drink equals one 12 oz bottle of beer (355 mL), one 5 oz glass of wine (148 mL), or one 1 oz glass of hard liquor (44 mL). General instructions  Schedule regular health, dental, and eye exams.  Stay current with your vaccines.  Tell your health care provider if: ? You often feel depressed. ? You have ever been abused or do not feel safe at home. Summary  Adopting a healthy lifestyle and getting preventive care are important in promoting health and wellness.  Follow your health care provider's instructions about healthy diet, exercising, and getting tested or screened for diseases.  Follow your health care provider's instructions on monitoring your cholesterol and blood pressure. This information is not intended to replace advice given to you by your health care provider. Make sure you discuss any questions you have with your health care provider. Document Released: 12/17/2010 Document Revised: 05/27/2018 Document Reviewed: 05/27/2018 Elsevier Patient Education  2020 Elsevier Inc.  

## 2019-02-01 NOTE — Progress Notes (Signed)
Gabriela Cantu 16-Feb-1991 [id number]    History:    Presents for annual exam.  01/2016 Nexplanon amenorrhea has appointment for tomorrow to have removed.  Pregnancy okay but will use condoms.  Normal Pap history.  Primary care manages hypertension and diabetes.  Weight is down 20 pounds, morbid obesity  Past medical history, past surgical history, family history and social history were all reviewed and documented in the EPIC chart.  Desk job.  Mother hypertension, kidney disease deceased father healthy.  ROS:  A ROS was performed and pertinent positives and negatives are included.  Exam:  Vitals:   02/01/19 0824  BP: 130/80  Weight: (!) 307 lb (139.3 kg)  Height: 5\' 3"  (1.6 m)   Body mass index is 54.38 kg/m.   General appearance:  Normal Thyroid:  Symmetrical, normal in size, without palpable masses or nodularity. Respiratory  Auscultation:  Clear without wheezing or rhonchi Cardiovascular  Auscultation:  Regular rate, without rubs, murmurs or gallops  Edema/varicosities:  Not grossly evident Abdominal  Soft,nontender, without masses, guarding or rebound.  Liver/spleen:  No organomegaly noted  Hernia:  None appreciated  Skin  Inspection:  Grossly normal   Breasts: Examined lying and sitting.     Right: Without masses, retractions, discharge or axillary adenopathy.     Left: Without masses, retractions, discharge or axillary adenopathy. Gentitourinary   Inguinal/mons:  Normal without inguinal adenopathy  External genitalia:  Normal  BUS/Urethra/Skene's glands:  Normal  Vagina:  Normal  Cervix:  Normal  Uterus:  normal in size, shape and contour.  Midline and mobile limited exam/obesity  Adnexa/parametria:     Rt: Without masses or tenderness.   Lt: Without masses or tenderness.  Anus and perineum: Normal    Assessment/Plan:  28 y.o. [name] for annual exam with no complaints.  01/2016 Nexplanon amenorrhea Hypertension, diabetes-primary care manages labs and  meds Morbid obesity  Plan: Keep scheduled appointment for tomorrow to have Nexplanon removed, reviewed may have slow return to menses planning to use condoms.  SBEs, reviewed importance of increasing daily exercise decreasing calories, weight watchers reviewed.  Multivitamin daily, safe pregnancy behaviors reviewed.  Pap.  New screening guidelines reviewed. [place], 8:42 AM 02/01/2019

## 2019-02-01 NOTE — Addendum Note (Signed)
Addended by: Janalyn Harder A on: 02/01/2019 09:00 AM   Modules accepted: Orders

## 2019-02-02 ENCOUNTER — Ambulatory Visit: Payer: BLUE CROSS/BLUE SHIELD | Admitting: Gynecology

## 2019-02-02 LAB — PAP IG W/ RFLX HPV ASCU

## 2019-02-02 LAB — NOVEL CORONAVIRUS, NAA: SARS-CoV-2, NAA: DETECTED — AB

## 2019-02-18 ENCOUNTER — Other Ambulatory Visit: Payer: Self-pay

## 2019-02-19 ENCOUNTER — Ambulatory Visit: Payer: BC Managed Care – PPO | Admitting: Gynecology

## 2019-02-19 ENCOUNTER — Encounter: Payer: Self-pay | Admitting: Gynecology

## 2019-02-19 ENCOUNTER — Other Ambulatory Visit: Payer: Self-pay

## 2019-02-19 VITALS — BP 126/82

## 2019-02-19 DIAGNOSIS — Z3046 Encounter for surveillance of implantable subdermal contraceptive: Secondary | ICD-10-CM

## 2019-02-19 NOTE — Patient Instructions (Signed)
Follow-up if any issues with the Nexplanon removal site

## 2019-02-19 NOTE — Progress Notes (Signed)
    Gabriela Cantu [birth year] [id number]        28 y.o.  [name] presents to have her Nexplanon removed.  It has been 3 years since placement.  Her and her husband are planning pregnancy.  She is on a multivitamin with folic acid.  She had tested positive for COVID 02/01/2019.  She was mildly symptomatic at that time.  She has not had symptoms for a number of days.  Past medical history,surgical history, problem list, medications, allergies, family history and social history were all reviewed and documented in the EPIC chart.  Directed ROS with pertinent positives and negatives documented in the history of present illness/assessment and plan.  Exam Copywriter, advertising Vitals:   02/19/19 1506  BP: 126/82   General appearance:  Normal  Procedure:  The removal process was reviewed with the patient and the potential risks were discussed to include bleeding, infection, damage to underlying neurovascular structures and the possibility that we may not be able to remove the rod or incomplete removal.  The left upper, inner arm was examined with the Nexplanon rod clearly palpated. The skin overlying the distal portion of the rod was cleansed with Betadine and infiltrated with 1% lidocaine. A small skin incision was made with a scalpel over the distal tip of the Nexplanon rod. The Nexplanon tip was subsequently delivered through the incision using blunt and sharp dissection and the tip was grasped with a forcep and the Nexplanon rod was removed intact, shown to the patient and discarded.  A Steri-Strip was used to close the small skin defect and a pressure dressing was applied with postoperative instructions given.   Assessment/Plan:  28 y.o. [name] with successful removal of Nexplanon.  Currently not having menses.  Will keep menstrual calendar and follow-up if has not had a menses within 2 months or irregular bleeding develops.   Gabriela Auerbach MD, 3:23 PM 02/19/2019

## 2019-03-08 ENCOUNTER — Encounter: Payer: Self-pay | Admitting: Gynecology

## 2019-03-12 DIAGNOSIS — I10 Essential (primary) hypertension: Secondary | ICD-10-CM | POA: Diagnosis not present

## 2019-03-12 DIAGNOSIS — H6123 Impacted cerumen, bilateral: Secondary | ICD-10-CM | POA: Diagnosis not present

## 2019-03-12 DIAGNOSIS — E782 Mixed hyperlipidemia: Secondary | ICD-10-CM | POA: Diagnosis not present

## 2019-03-12 DIAGNOSIS — E1165 Type 2 diabetes mellitus with hyperglycemia: Secondary | ICD-10-CM | POA: Diagnosis not present

## 2019-03-30 ENCOUNTER — Other Ambulatory Visit: Payer: Self-pay

## 2019-03-31 ENCOUNTER — Encounter: Payer: Self-pay | Admitting: Women's Health

## 2019-03-31 ENCOUNTER — Ambulatory Visit: Payer: BC Managed Care – PPO | Admitting: Women's Health

## 2019-03-31 VITALS — BP 130/84

## 2019-03-31 DIAGNOSIS — L680 Hirsutism: Secondary | ICD-10-CM

## 2019-03-31 MED ORDER — SPIRONOLACTONE 25 MG PO TABS: 25.0000 mg | ORAL_TABLET | Freq: Every day | ORAL | 1 refills | Status: DC

## 2019-03-31 NOTE — Patient Instructions (Signed)
Spironolactone tablets What is this medicine? SPIRONOLACTONE (speer on oh LAK tone) is a diuretic. It helps you make more urine and to lose excess water from your body. This medicine is used to treat high blood pressure, and edema or swelling from heart, kidney, or liver disease. It is also used to treat patients who make too much aldosterone or have low potassium. This medicine may be used for other purposes; ask your health care provider or pharmacist if you have questions. COMMON BRAND NAME(S): Aldactone What should I tell my health care provider before I take this medicine? They need to know if you have any of these conditions:  high blood level of potassium  kidney disease or trouble making urine  liver disease  an unusual or allergic reaction to spironolactone, other medicines, foods, dyes, or preservatives  pregnant or trying to get pregnant  breast-feeding How should I use this medicine? Take this medicine by mouth with a drink of water. Follow the directions on your prescription label. You can take it with or without food. If it upsets your stomach, take it with food. Do not take your medicine more often than directed. Remember that you will need to pass more urine after taking this medicine. Do not take your doses at a time of day that will cause you problems. Do not take at bedtime. Talk to your pediatrician regarding the use of this medicine in children. While this drug may be prescribed for selected conditions, precautions do apply. Overdosage: If you think you have taken too much of this medicine contact a poison control center or emergency room at once. NOTE: This medicine is only for you. Do not share this medicine with others. What if I miss a dose? If you miss a dose, take it as soon as you can. If it is almost time for your next dose, take only that dose. Do not take double or extra doses. What may interact with this medicine? Do not take this medicine with any of the  following medications:  cidofovir  eplerenone  tranylcypromine This medicine may also interact with the following medications:  aspirin  certain medicines for blood pressure or heart disease like benazepril, lisinopril, losartan, valsartan  certain medicines that treat or prevent blood clots like heparin and enoxaparin  cholestyramine  cyclosporine  digoxin  lithium  medicines that relax muscles for surgery  NSAIDs, medicines for pain and inflammation, like ibuprofen or naproxen  other diuretics  potassium supplements  steroid medicines like prednisone or cortisone  trimethoprim This list may not describe all possible interactions. Give your health care provider a list of all the medicines, herbs, non-prescription drugs, or dietary supplements you use. Also tell them if you smoke, drink alcohol, or use illegal drugs. Some items may interact with your medicine. What should I watch for while using this medicine? Visit your doctor or health care professional for regular checks on your progress. Check your blood pressure as directed. Ask your doctor what your blood pressure should be, and when you should contact them. You may need to be on a special diet while taking this medicine. Ask your doctor. Also, ask how many glasses of fluid you need to drink a day. You must not get dehydrated. This medicine may make you feel confused, dizzy or lightheaded. Drinking alcohol and taking some medicines can make this worse. Do not drive, use machinery, or do anything that needs mental alertness until you know how this medicine affects you. Do not sit or   stand up quickly. What side effects may I notice from receiving this medicine? Side effects that you should report to your doctor or health care professional as soon as possible:  allergic reactions such as skin rash or itching, hives, swelling of the lips, mouth, tongue, or throat  black or tarry stools  fast, irregular  heartbeat  fever  muscle pain, cramps  numbness, tingling in hands or feet  trouble breathing  trouble passing urine  unusual bleeding  unusually weak or tired Side effects that usually do not require medical attention (report to your doctor or health care professional if they continue or are bothersome):  change in voice or hair growth  confusion  dizzy, drowsy  dry mouth, increased thirst  enlarged or tender breasts  headache  irregular menstrual periods  sexual difficulty, unable to have an erection  stomach upset This list may not describe all possible side effects. Call your doctor for medical advice about side effects. You may report side effects to FDA at 1-800-FDA-1088. Where should I keep my medicine? Keep out of the reach of children. Store below 25 degrees C (77 degrees F). Throw away any unused medicine after the expiration date. NOTE: This sheet is a summary. It may not cover all possible information. If you have questions about this medicine, talk to your doctor, pharmacist, or health care provider.  2020 Elsevier/Gold Standard (2016-10-18 09:42:28)  

## 2019-03-31 NOTE — Progress Notes (Signed)
28 year old G0 complains of chin/neck hair. Patient will remove hair and it grows back within 24 hours. History of PCOS. Nexplanon removal February 19, 2019.  Has not had a monthly cycle yet.  Sexually active/condoms is currently considering bariatric surgery January 2021. Desires to get pregnant after surgery. Denies flu vaccine. Has sedentary lifestyle.  On a low-carb diet and reports an improvement in hemoglobin A1c.  Medical problems include hypertension, diabetes.  Had Covid in August 2020 symptoms better.  Exam: Morbidly obese.  Facial hair bilateral chin area extending to her neck  Hirsutism PCOS  Plan: Options reviewed, has used spironolactone in the past with no problems would like to try again.  Spironolactone 25 MG tab PO daily for facial hair. Encouraged to perform moderately-intense exercises 3-4 times a week for at least 30 minutes. Encourage use of multivitamin.  Coagulated on decreasing hemoglobin A1c, weight loss surgery discussed and encouraged.  Reviewed it is common to take several months to get a cycle after Nexplanon removed.  Instructed to call if no cycles in the next 2 months.

## 2019-06-07 DIAGNOSIS — E1165 Type 2 diabetes mellitus with hyperglycemia: Secondary | ICD-10-CM | POA: Diagnosis not present

## 2019-06-07 DIAGNOSIS — E782 Mixed hyperlipidemia: Secondary | ICD-10-CM | POA: Diagnosis not present

## 2019-06-07 DIAGNOSIS — I10 Essential (primary) hypertension: Secondary | ICD-10-CM | POA: Diagnosis not present

## 2019-06-22 DIAGNOSIS — E782 Mixed hyperlipidemia: Secondary | ICD-10-CM | POA: Diagnosis not present

## 2019-06-22 DIAGNOSIS — E1121 Type 2 diabetes mellitus with diabetic nephropathy: Secondary | ICD-10-CM | POA: Diagnosis not present

## 2019-06-22 DIAGNOSIS — I10 Essential (primary) hypertension: Secondary | ICD-10-CM | POA: Diagnosis not present

## 2019-06-22 DIAGNOSIS — E282 Polycystic ovarian syndrome: Secondary | ICD-10-CM | POA: Diagnosis not present

## 2019-09-07 DIAGNOSIS — H5213 Myopia, bilateral: Secondary | ICD-10-CM | POA: Diagnosis not present

## 2019-09-07 DIAGNOSIS — H4720 Unspecified optic atrophy: Secondary | ICD-10-CM | POA: Diagnosis not present

## 2019-09-07 DIAGNOSIS — E119 Type 2 diabetes mellitus without complications: Secondary | ICD-10-CM | POA: Diagnosis not present

## 2019-09-13 DIAGNOSIS — I10 Essential (primary) hypertension: Secondary | ICD-10-CM | POA: Diagnosis not present

## 2019-09-13 DIAGNOSIS — E1121 Type 2 diabetes mellitus with diabetic nephropathy: Secondary | ICD-10-CM | POA: Diagnosis not present

## 2019-09-13 DIAGNOSIS — D72829 Elevated white blood cell count, unspecified: Secondary | ICD-10-CM | POA: Diagnosis not present

## 2019-09-14 ENCOUNTER — Other Ambulatory Visit: Payer: Self-pay | Admitting: Women's Health

## 2019-09-14 NOTE — Telephone Encounter (Signed)
Okay to fill with refills

## 2019-10-21 DIAGNOSIS — I10 Essential (primary) hypertension: Secondary | ICD-10-CM | POA: Diagnosis not present

## 2019-10-21 DIAGNOSIS — Z982 Presence of cerebrospinal fluid drainage device: Secondary | ICD-10-CM | POA: Diagnosis not present

## 2019-10-21 DIAGNOSIS — E782 Mixed hyperlipidemia: Secondary | ICD-10-CM | POA: Diagnosis not present

## 2020-11-01 IMAGING — DX DG CERVICAL SPINE 1V
1 series · 1 of 1 positions shown · non-contrast
Comparison: None.

CLINICAL DATA: Evaluate shunt, history of headaches

EXAM:
DG CERVICAL SPINE - 1 VIEW

[dg cervical spine 1 view]
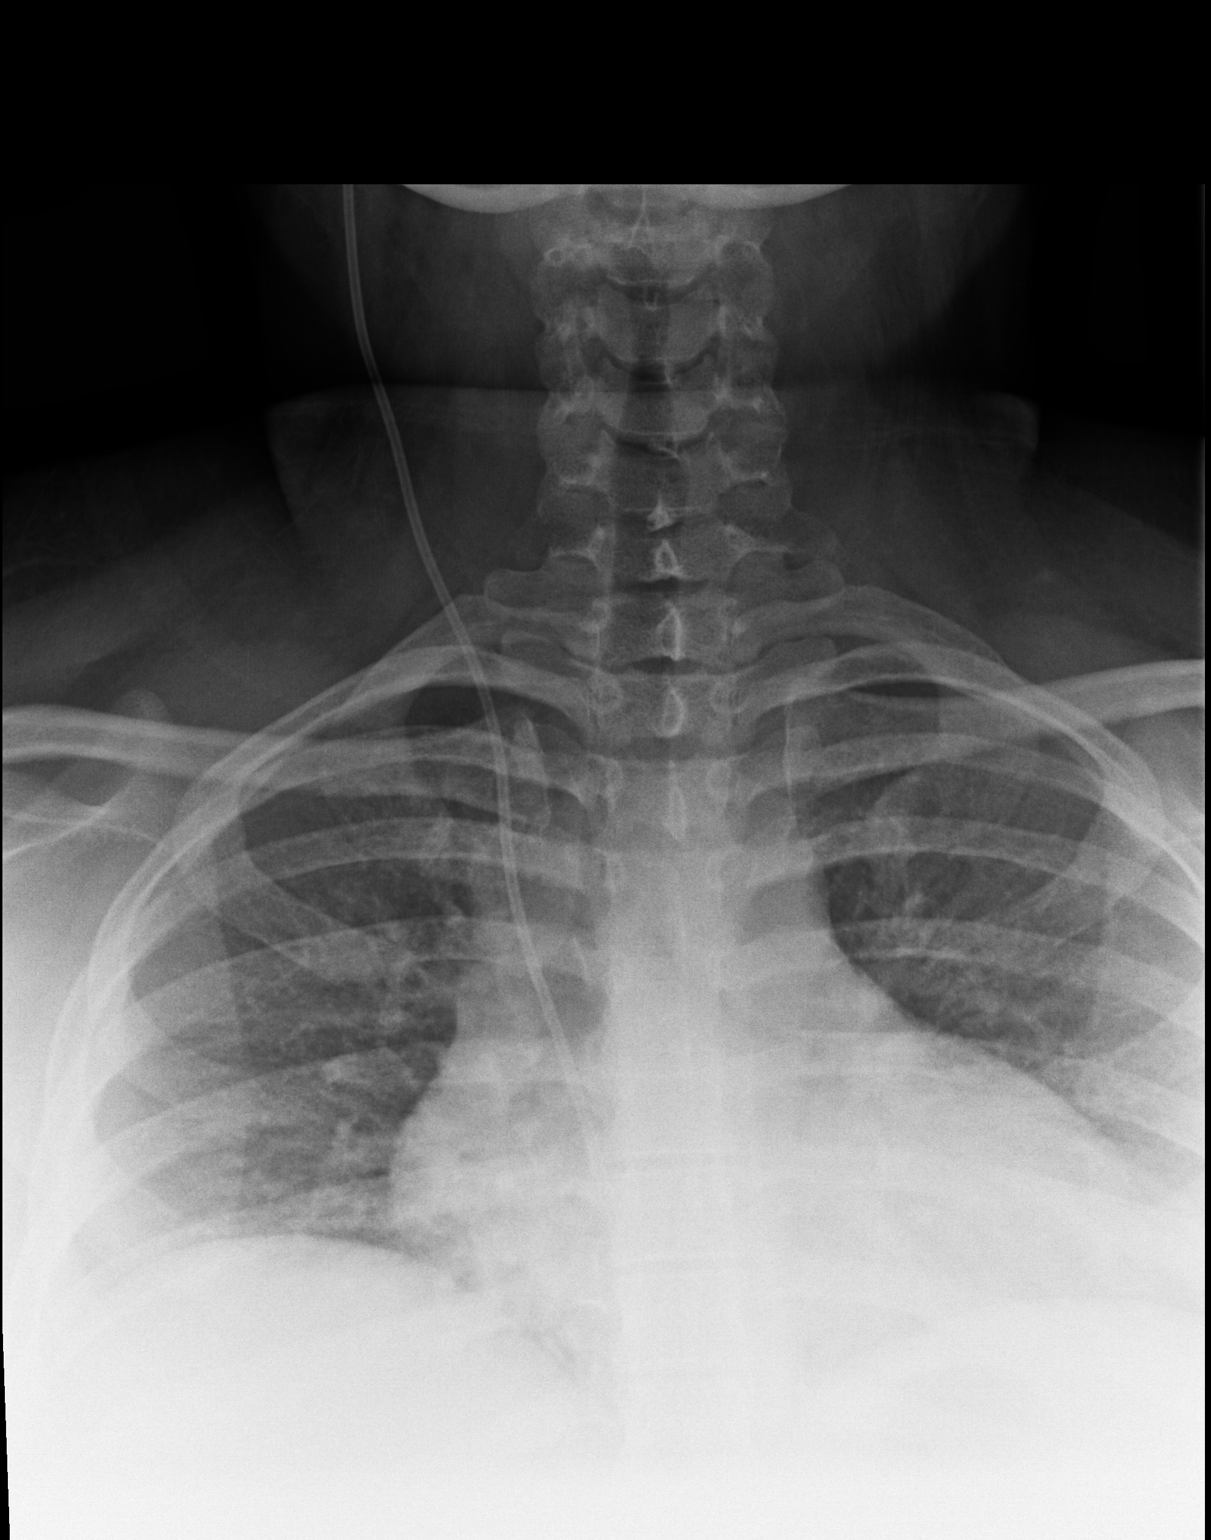

[1 of 1 positions shown; findings below may reference images not displayed]

FINDINGS: Visualized ventriculoperitoneal shunt is noted in satisfactory
position. No catheter breaks are seen. The lungs are hypoinflated
but clear. No bony abnormality is noted.
IMPRESSION: Intact shunt catheter.

## 2020-11-01 IMAGING — DX DG ABDOMEN 1V
1 series · 1 of 1 positions shown · non-contrast
Comparison: None.

CLINICAL DATA: Headaches, check shunt catheter

EXAM:
ABDOMEN - 1 VIEW

[dg abd 1 view]
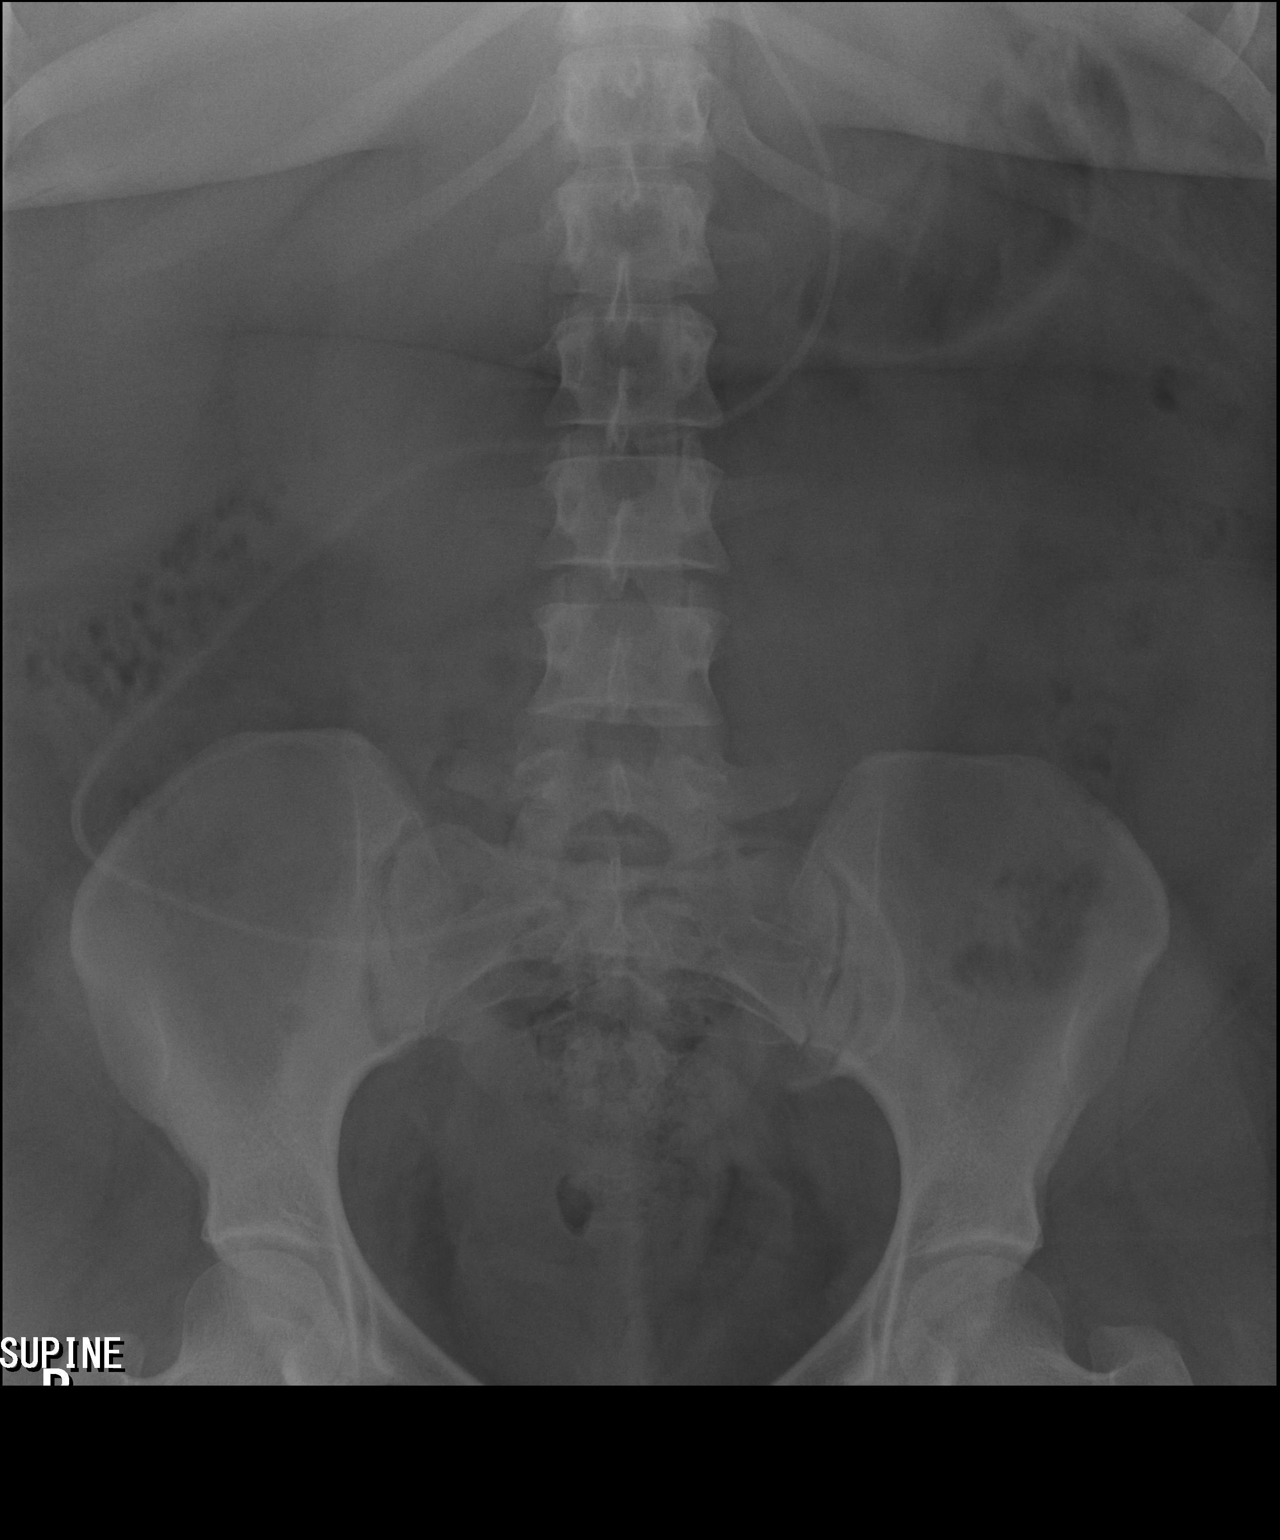

[1 of 1 positions shown; findings below may reference images not displayed]

FINDINGS: Scattered large and small bowel gas is noted. Shunt catheter extends
into the left mid abdomen. No acute abnormality is noted.
IMPRESSION: Intact shunt catheter.

## 2021-03-22 ENCOUNTER — Ambulatory Visit (INDEPENDENT_AMBULATORY_CARE_PROVIDER_SITE_OTHER): Payer: 59 | Admitting: Nurse Practitioner

## 2021-03-22 ENCOUNTER — Other Ambulatory Visit: Payer: Self-pay

## 2021-03-22 ENCOUNTER — Encounter: Payer: Self-pay | Admitting: Nurse Practitioner

## 2021-03-22 VITALS — BP 118/80 | Ht 63.0 in | Wt 218.0 lb

## 2021-03-22 DIAGNOSIS — Z01419 Encounter for gynecological examination (general) (routine) without abnormal findings: Secondary | ICD-10-CM

## 2021-03-22 NOTE — Progress Notes (Signed)
   Gabriela Cantu Apr 18, 1991 [id number]   History:  30 y.o. G0 presents for annual exam. Monthly cycles, no contraception. Normal pap history. Roux-en-y 03/01/2020, down 109 lb. Considering pregnancy and talked to surgeon about it. Taking PNV. History PCOS, HTN, DM.   Gynecologic History Patient's last menstrual period was 02/26/2021. Period Cycle (Days): 28 Period Duration (Days): 5 Period Pattern: Regular Menstrual Flow: Moderate Menstrual Control: Maxi pad, Tampon Dysmenorrhea: (!) Mild Dysmenorrhea Symptoms: Cramping Contraception/Family planning: none Sexually active: Yes  Health Maintenance Last Pap: 02/01/2019. Results were: Normal Last mammogram: Not indicated Last colonoscopy: Not indicated Last Dexa: Not indicated  Past medical history, past surgical history, family history and social history were all reviewed and documented in the EPIC chart. Married. Works for [employer].   ROS:  A ROS was performed and pertinent positives and negatives are included.  Exam:  Vitals:   03/22/21 1443  BP: 118/80  Weight: 218 lb (98.9 kg)  Height: 5\' 3"  (1.6 m)   Body mass index is 38.62 kg/m.  General appearance:  Normal Thyroid:  Symmetrical, normal in size, without palpable masses or nodularity. Respiratory  Auscultation:  Clear without wheezing or rhonchi Cardiovascular  Auscultation:  Regular rate, without rubs, murmurs or gallops  Edema/varicosities:  Not grossly evident Abdominal  Soft,nontender, without masses, guarding or rebound.  Liver/spleen:  No organomegaly noted  Hernia:  None appreciated  Skin  Inspection:  Grossly normal Breasts: Examined lying and sitting.   Right: Without masses, retractions, nipple discharge or axillary adenopathy.   Left: Without masses, retractions, nipple discharge or axillary adenopathy. Genitourinary   Inguinal/mons:  Normal without inguinal adenopathy  External genitalia:  Normal appearing vulva with no masses, tenderness, or  lesions  BUS/Urethra/Skene's glands:  Normal  Vagina:  Normal appearing with normal color and discharge, no lesions  Cervix:  Normal appearing without discharge or lesions  Uterus:  Difficult to palpate due to body habitus but no gross masses or tenderness  Adnexa/parametria:     Rt: Normal in size, without masses or tenderness.   Lt: Normal in size, without masses or tenderness.  Anus and perineum: Normal  Patient informed chaperone available to be present for breast and pelvic exam. Patient has requested no chaperone to be present. Patient has been advised what will be completed during breast and pelvic exam.   Assessment/Plan:  30 y.o. G0 for annual exam.   Well female exam with routine gynecological exam - Education provided on SBEs, importance of preventative screenings, current guidelines, high calcium diet, regular exercise, and multivitamin daily.  Labs with bariatric surgeon and PCP. Congratulated on weight loss.   Screening for cervical cancer - Normal pap history. Will repeat at 3-year interval per guidelines.   Return in 1 year for annual.   37 DNP, 2:58 PM 03/22/2021

## 2021-04-04 ENCOUNTER — Other Ambulatory Visit: Payer: Self-pay

## 2021-04-04 ENCOUNTER — Encounter: Payer: Self-pay | Admitting: Obstetrics and Gynecology

## 2021-04-04 ENCOUNTER — Ambulatory Visit (INDEPENDENT_AMBULATORY_CARE_PROVIDER_SITE_OTHER): Payer: 59 | Admitting: Obstetrics and Gynecology

## 2021-04-04 VITALS — BP 120/76 | Wt 216.0 lb

## 2021-04-04 DIAGNOSIS — N926 Irregular menstruation, unspecified: Secondary | ICD-10-CM | POA: Diagnosis not present

## 2021-04-04 DIAGNOSIS — N760 Acute vaginitis: Secondary | ICD-10-CM

## 2021-04-04 LAB — WET PREP FOR TRICH, YEAST, CLUE

## 2021-04-04 LAB — PREGNANCY, URINE: Preg Test, Ur: POSITIVE — AB

## 2021-04-04 NOTE — Patient Instructions (Addendum)
Consider reading What to Expect When Expecting, a good book and guide to pregnancy care.  Prenatal Care Prenatal care is health care during pregnancy. It helps you and your unborn baby (fetus) stay as healthy as possible. Prenatal care may be provided by a midwife, a family practice doctor, a Dispensing optician (nurse practitioner or physician assistant), or a childbirth and pregnancy doctor (obstetrician). How does this affect me? During pregnancy, you will be closely monitored for any new conditions that might develop. To lower your risk of pregnancy complications, you and your health care provider will talk about any underlying conditions you have. How does this affect my baby? Early and consistent prenatal care increases the chance that your baby will be healthy during pregnancy. Prenatal care lowers the risk that your baby will be: Born early (prematurely). Smaller than expected at birth (small for gestational age). What can I expect at the first prenatal care visit? Your first prenatal care visit will likely be the longest. You should schedule your first prenatal care visit as soon as you know that you are pregnant. Your first visit is a good time to talk about any questions or concerns you have about pregnancy. Medical history At your visit, you and your health care provider will talk about your medical history, including: Any past pregnancies. Your family's medical history. Medical history of the baby's father. Any long-term (chronic) health conditions you have and how you manage them. Any surgeries or procedures you have had. Any current over-the-counter or prescription medicines, herbs, or supplements that you are taking. Other factors that could pose a risk to your baby, including: Exposure to harmful chemicals or radiation at work or at home. Any substance use, including tobacco, alcohol, and drug use. Your home setting and your stress levels, including: Exposure to abuse or  violence. Household financial strain. Your daily health habits, including diet and exercise. Tests and screenings Your health care provider will: Measure your weight, height, and blood pressure. Do a physical exam, including a pelvic and breast exam. Perform blood tests and urine tests to check for: Urinary tract infection. Sexually transmitted infections (STIs). Low iron levels in your blood (anemia). Blood type and certain proteins on red blood cells (Rh antibodies). Infections and immunity to viruses, such as hepatitis B and rubella. HIV (human immunodeficiency virus). Discuss your options for genetic screening. Tips about staying healthy Your health care provider will also give you information about how to keep yourself and your baby healthy, including: Nutrition and taking vitamins. Physical activity. How to manage pregnancy symptoms such as nausea and vomiting (morning sickness). Infections and substances that may be harmful to your baby and how to avoid them. Food safety. Dental care. Working. Travel. Warning signs to watch for and when to call your health care provider. How often will I have prenatal care visits? After your first prenatal care visit, you will have regular visits throughout your pregnancy. The visit schedule is often as follows: Up to week 28 of pregnancy: once every 4 weeks. 28-36 weeks: once every 2 weeks. After 36 weeks: every week until delivery. Some women may have visits more or less often depending on any underlying health conditions and the health of the baby. Keep all follow-up and prenatal care visits. This is important. What happens during routine prenatal care visits? Your health care provider will: Measure your weight and blood pressure. Check for fetal heart sounds. Measure the height of your uterus in your abdomen (fundal height). This may be measured starting  around week 20 of pregnancy. Check the position of your baby inside your  uterus. Ask questions about your diet, sleeping patterns, and whether you can feel the baby move. Review warning signs to watch for and signs of labor. Ask about any pregnancy symptoms you are having and how you are dealing with them. Symptoms may include: Headaches. Nausea and vomiting. Vaginal discharge. Swelling. Fatigue. Constipation. Changes in your vision. Feeling persistently sad or anxious. Any discomfort, including back or pelvic pain. Bleeding or spotting. Make a list of questions to ask your health care provider at your routine visits. What tests might I have during prenatal care visits? You may have blood, urine, and imaging tests throughout your pregnancy, such as: Urine tests to check for glucose, protein, or signs of infection. Glucose tests to check for a form of diabetes that can develop during pregnancy (gestational diabetes mellitus). This is usually done around week 24 of pregnancy. Ultrasounds to check your baby's growth and development, to check for birth defects, and to check your baby's well-being. These can also help to decide when you should deliver your baby. A test to check for group B strep (GBS) infection. This is usually done around week 36 of pregnancy. Genetic testing. This may include blood, fluid, or tissue sampling, or imaging tests, such as an ultrasound. Some genetic tests are done during the first trimester and some are done during the second trimester. What else can I expect during prenatal care visits? Your health care provider may recommend getting certain vaccines during pregnancy. These may include: A yearly flu shot (annual influenza vaccine). This is especially important if you will be pregnant during flu season. Tdap (tetanus, diphtheria, pertussis) vaccine. Getting this vaccine during pregnancy can protect your baby from whooping cough (pertussis) after birth. This vaccine may be recommended between weeks 27 and 36 of pregnancy. A COVID-19  vaccine. Later in your pregnancy, your health care provider may give you information about: Childbirth and breastfeeding classes. Choosing a health care provider for your baby. Umbilical cord banking. Breastfeeding. Birth control after your baby is born. The hospital labor and delivery unit and how to set up a tour. Registering at the hospital before you go into labor. Where to find more information Office on Women's Health: TravelLesson.ca American Pregnancy Association: americanpregnancy.org March of Dimes: marchofdimes.org Summary Prenatal care helps you and your baby stay as healthy as possible during pregnancy. Your first prenatal care visit will most likely be the longest. You will have visits and tests throughout your pregnancy to monitor your health and your baby's health. Bring a list of questions to your visits to ask your health care provider. Make sure to keep all follow-up and prenatal care visits. This information is not intended to replace advice given to you by your health care provider. Make sure you discuss any questions you have with your health care provider. Document Revised: 03/16/2020 Document Reviewed: 03/16/2020 Elsevier Patient Education  2022 [employer].

## 2021-04-04 NOTE — Progress Notes (Signed)
GYNECOLOGY  VISIT   HPI: 30 y.o.   Married  Latino  female   [name] with Patient's last menstrual period was 02/26/2021 (exact date).   here for missed menses. Patient complaining of vaginal burning/itching.   Some discharge.  No self treatment.   Husband is present for the visit today.  Patient has had 4 positive home UPTs.  Breast tenderness, decreased appetite, almost feels like her period will start.  No bleeding at all.   Taking PNV.    UPT:Pos  GYNECOLOGIC HISTORY: Patient's last menstrual period was 02/26/2021 (exact date). Contraception:  None Menopausal hormone therapy:  none Last mammogram:  n/a Last pap smear:  02-01-19 Neg, 08-22-15 Neg        OB History     Gravida  0   Para  0   Term  0   Preterm  0   AB  0   Living  0      SAB  0   IAB  0   Ectopic  0   Multiple  0   Live Births                 Patient Active Problem List   Diagnosis Date Noted   Nexplanon in place 02/15/2016   DUB (dysfunctional uterine bleeding) 09/06/2015   Hyperglycemia 08/23/2015   Pseudotumor cerebri 07/14/2012   Obesity 07/14/2012   Visual field loss 07/14/2012    Past Medical History:  Diagnosis Date   Diabetes mellitus without complication (HCC)    Hypertension    Morbid obesity (HCC)    Pseudotumor cerebri     Past Surgical History:  Procedure Laterality Date   GASTRIC BYPASS     LAPAROSCOPIC REVISION VENTRICULAR-PERITONEAL (V-P) SHUNT Left 07/28/2012   Procedure: LAPAROSCOPIC REVISION VENTRICULAR-PERITONEAL (V-P) SHUNT;  Surgeon: Adolph Pollack, MD;  Location: [facility];  Service: General;  Laterality: Left;  Ventricular-peritoneal shunt placement with Dr. Abbey Chatters   VENTRICULOPERITONEAL SHUNT N/A 07/28/2012   Procedure: SHUNT INSERTION VENTRICULAR-PERITONEAL;  Surgeon: Maeola Harman, MD;  Location: [facility];  Service: Neurosurgery;  Laterality: N/A;  Ventricular-peritoneal shunt placement with Dr. Abbey Chatters    Current Outpatient  Medications  Medication Sig Dispense Refill   Cyanocobalamin (VITAMIN B-12 PO) Take by mouth.     Misc. Devices MISC CPAP therapy at 10 cm. water pressure with EPR 3.  CPAP machine with a mask and supplies per patient preference for OSA.  ResMed AirFit P10(XS) nasal pillow mask.  Send to a DME in [place].     Multiple Vitamin (MULTIVITAMIN) capsule Take 1 capsule by mouth daily.     Prenatal Vit-Fe Fumarate-FA (MULTIVITAMIN-PRENATAL) 27-0.8 MG TABS tablet Take 1 tablet by mouth daily at 12 noon.     CALCIUM PO Take 1 tablet by mouth daily. (Patient not taking: Reported on 04/04/2021)     No current facility-administered medications for this visit.     ALLERGIES: Patient has no known allergies.  Family History  Problem Relation Age of Onset   Hypertension Mother    Kidney disease Mother    Hypertension Maternal Aunt    Hypertension Maternal Grandmother    Hypertension Maternal Grandfather     Social History   Socioeconomic History   Marital status: Married    Spouse name: Not on file   Number of children: Not on file   Years of education: Not on file   Highest education level: Not on file  Occupational History   Occupation: housekeeping  Employer: [employer]   Tobacco Use   Smoking status: Never   Smokeless tobacco: Never  Vaping Use   Vaping Use: Never used  Substance and Sexual Activity   Alcohol use: Not Currently   Drug use: No   Sexual activity: Yes    Birth control/protection: None    Comment: 1ST INTERCOURSE- 67, PARTNERS- 1   Other Topics Concern   Not on file  Social History Narrative   Drinks caffeine once a week    Social Determinants of Corporate investment banker Strain: Not on file  Food Insecurity: Not on file  Transportation Needs: Not on file  Physical Activity: Not on file  Stress: Not on file  Social Connections: Not on file  Intimate Partner Violence: Not on file    Review of Systems  Genitourinary:  Positive for vaginal  discharge (vaginal itching).  All other systems reviewed and are negative.  PHYSICAL EXAMINATION:    BP 120/76   Wt 216 lb (98 kg)   LMP 02/26/2021 (Exact Date)   BMI 38.26 kg/m     General appearance: alert, cooperative and appears stated age   Pelvic: External genitalia:  no lesions              Urethra:  normal appearing urethra with no masses, tenderness or lesions              Bartholins and Skenes: normal                 Vagina: normal appearing vagina with normal color and white-green curd like discharge, no lesions              Cervix: no lesions                Bimanual Exam:  Uterus:  normal size, contour, position, consistency, mobility, non-tender              Adnexa: no mass, fullness, tenderness        Chaperone was present for exam:  Marchelle Folks, CMA.  ASSESSMENT  Missed menses. 5+[redacted] weeks gestation.  [facility] 12/05/20. Vaginitis.   PLAN  Avoid exposures in pregnancy. Pregnancy reading discussed:  What to Expect When Expecting. She will establish care with OB team.  List of providers to patient.  Wet prep:  yeast noted.  I recommend Monistat 3 or 7 day treatment.   Fu here after post partum visit complete.  An After Visit Summary was printed and given to the patient.  22 min total time was spent for this patient encounter, including preparation, face-to-face counseling with the patient, coordination of care, and documentation of the encounter.

## 2021-05-02 LAB — OB RESULTS CONSOLE HEPATITIS B SURFACE ANTIGEN: Hepatitis B Surface Ag: NEGATIVE

## 2021-05-02 LAB — OB RESULTS CONSOLE RUBELLA ANTIBODY, IGM: Rubella: IMMUNE

## 2021-05-02 LAB — OB RESULTS CONSOLE HIV ANTIBODY (ROUTINE TESTING): HIV: NONREACTIVE

## 2021-05-02 LAB — OB RESULTS CONSOLE ABO/RH: RH Type: POSITIVE

## 2021-06-12 ENCOUNTER — Inpatient Hospital Stay (HOSPITAL_COMMUNITY)
Admission: AD | Admit: 2021-06-12 | Discharge: 2021-06-12 | Disposition: A | Discharge status: Home / Self Care | Payer: 59 | Attending: Obstetrics and Gynecology | Admitting: Obstetrics and Gynecology

## 2021-06-12 ENCOUNTER — Encounter (HOSPITAL_COMMUNITY): Payer: Self-pay | Admitting: *Deleted

## 2021-06-12 ENCOUNTER — Other Ambulatory Visit: Payer: Self-pay

## 2021-06-12 DIAGNOSIS — R141 Gas pain: Secondary | ICD-10-CM | POA: Diagnosis not present

## 2021-06-12 DIAGNOSIS — R1084 Generalized abdominal pain: Secondary | ICD-10-CM | POA: Insufficient documentation

## 2021-06-12 DIAGNOSIS — O26892 Other specified pregnancy related conditions, second trimester: Secondary | ICD-10-CM | POA: Diagnosis not present

## 2021-06-12 DIAGNOSIS — Z3A15 15 weeks gestation of pregnancy: Secondary | ICD-10-CM

## 2021-06-12 LAB — URINALYSIS, ROUTINE W REFLEX MICROSCOPIC
Bilirubin Urine: NEGATIVE
Glucose, UA: NEGATIVE mg/dL
Hgb urine dipstick: NEGATIVE
Ketones, ur: NEGATIVE mg/dL
Leukocytes,Ua: NEGATIVE
Nitrite: NEGATIVE
Protein, ur: NEGATIVE mg/dL
Specific Gravity, Urine: <1.005 — ABNORMAL LOW (ref 1.005–1.030)
pH: 7 (ref 5.0–8.0)

## 2021-06-12 MED ORDER — LIDOCAINE VISCOUS HCL 2 % MT SOLN
15.0000 mL | Freq: Once | OROMUCOSAL | Status: AC
  Administered 2021-06-12: 11:00:00 15 mL via ORAL
  Filled 2021-06-12: qty 15

## 2021-06-12 MED ORDER — SIMETHICONE 80 MG PO CHEW
80.0000 mg | CHEWABLE_TABLET | Freq: Once | ORAL | Status: AC
  Administered 2021-06-12: 12:00:00 80 mg via ORAL
  Filled 2021-06-12: qty 1

## 2021-06-12 MED ORDER — SIMETHICONE 80 MG PO CHEW: 80.0000 mg | CHEWABLE_TABLET | Freq: Four times a day (QID) | ORAL | 2 refills | Status: AC | PRN

## 2021-06-12 MED ORDER — ALUM & MAG HYDROXIDE-SIMETH 200-200-20 MG/5ML PO SUSP
30.0000 mL | Freq: Once | ORAL | Status: AC
  Administered 2021-06-12: 11:00:00 30 mL via ORAL
  Filled 2021-06-12: qty 30

## 2021-06-12 MED ORDER — ACETAMINOPHEN 500 MG PO TABS
1000.0000 mg | ORAL_TABLET | Freq: Once | ORAL | Status: AC
  Administered 2021-06-12: 11:00:00 1000 mg via ORAL
  Filled 2021-06-12: qty 2

## 2021-06-12 NOTE — MAU Provider Note (Signed)
History     CSN: [id number]  Arrival date and time: 06/12/21 1006   Event Date/Time   First Provider Initiated Contact with Patient 06/12/21 1053      Chief Complaint  Patient presents with   Abdominal Pain   HPI Gabriela Cantu is a 30 y.o. G1P0000 at [redacted]w[redacted]d who presents with generalized abdominal pain. She states the pain is constant and rates the pain an 8/10. She points to her umbilicus and above as the location of her pain. She has not tried anything for the pain. She denies any bleeding or leaking.   OB History     Gravida  1   Para  0   Term  0   Preterm  0   AB  0   Living  0      SAB  0   IAB  0   Ectopic  0   Multiple  0   Live Births              Past Medical History:  Diagnosis Date   Diabetes mellitus without complication (HCC)    Hypertension    Morbid obesity (HCC)    Pseudotumor cerebri     Past Surgical History:  Procedure Laterality Date   GASTRIC BYPASS     LAPAROSCOPIC REVISION VENTRICULAR-PERITONEAL (V-P) SHUNT Left 07/28/2012   Procedure: LAPAROSCOPIC REVISION VENTRICULAR-PERITONEAL (V-P) SHUNT;  Surgeon: Adolph Pollack, MD;  Location: [facility];  Service: General;  Laterality: Left;  Ventricular-peritoneal shunt placement with Dr. Abbey Chatters   VENTRICULOPERITONEAL SHUNT N/A 07/28/2012   Procedure: SHUNT INSERTION VENTRICULAR-PERITONEAL;  Surgeon: Maeola Harman, MD;  Location: [facility];  Service: Neurosurgery;  Laterality: N/A;  Ventricular-peritoneal shunt placement with Dr. Abbey Chatters    Family History  Problem Relation Age of Onset   Hypertension Mother    Kidney disease Mother    Hypertension Maternal Aunt    Hypertension Maternal Grandmother    Hypertension Maternal Grandfather     Social History   Tobacco Use   Smoking status: Never   Smokeless tobacco: Never  Vaping Use   Vaping Use: Never used  Substance Use Topics   Alcohol use: Not Currently   Drug use: No    Allergies: No Known  Allergies  Medications Prior to Admission  Medication Sig Dispense Refill Last Dose   CALCIUM PO Take 1 tablet by mouth daily.   06/11/2021   Cholecalciferol (VITAMIN D3 PO) Take 2 tablets by mouth daily.   06/11/2021   Cyanocobalamin (VITAMIN B-12 PO) Take by mouth.   06/11/2021   Multiple Vitamin (MULTIVITAMIN) capsule Take 1 capsule by mouth daily.   06/11/2021   Prenatal Vit-Fe Fumarate-FA (MULTIVITAMIN-PRENATAL) 27-0.8 MG TABS tablet Take 1 tablet by mouth daily at 12 noon.   06/11/2021   Misc. Devices MISC CPAP therapy at 10 cm. water pressure with EPR 3.  CPAP machine with a mask and supplies per patient preference for OSA.  ResMed AirFit P10(XS) nasal pillow mask.  Send to a DME in [place].       Review of Systems  Constitutional: Negative.  Negative for fatigue and fever.  HENT: Negative.    Respiratory: Negative.  Negative for shortness of breath.   Cardiovascular: Negative.  Negative for chest pain.  Gastrointestinal:  Positive for abdominal pain. Negative for constipation, diarrhea, nausea and vomiting.  Genitourinary: Negative.  Negative for dysuria, vaginal bleeding and vaginal discharge.  Neurological: Negative.  Negative for dizziness and headaches.  Physical Exam  Blood pressure 133/89, pulse 89, temperature 98.4 F (36.9 C), temperature source Oral, resp. rate 18, height 5\' 3"  (1.6 m), weight 100.7 kg, last menstrual period 02/26/2021, SpO2 100 %.  Physical Exam Vitals and nursing note reviewed.  Constitutional:      General: She is not in acute distress.    Appearance: She is well-developed.  HENT:     Head: Normocephalic.  Eyes:     Pupils: Pupils are equal, round, and reactive to light.  Cardiovascular:     Rate and Rhythm: Normal rate and regular rhythm.     Heart sounds: Normal heart sounds.  Pulmonary:     Effort: Pulmonary effort is normal. No respiratory distress.     Breath sounds: Normal breath sounds.  Abdominal:     General: Bowel sounds are  normal. There is no distension.     Palpations: Abdomen is soft.     Tenderness: There is no abdominal tenderness. There is no guarding or rebound.  Skin:    General: Skin is warm and dry.  Neurological:     Mental Status: She is alert and oriented to person, place, and time.  Psychiatric:        Mood and Affect: Mood normal.        Behavior: Behavior normal.        Thought Content: Thought content normal.        Judgment: Judgment normal.   FHT: 155 bpm  MAU Course  Procedures Results for orders placed or performed during the hospital encounter of 06/12/21 (from the past 24 hour(s))  Urinalysis, Routine w reflex microscopic Urine, Clean Catch     Status: Abnormal   Collection Time: 06/12/21 12:25 PM  Result Value Ref Range   Color, Urine YELLOW YELLOW   APPearance CLEAR CLEAR   Specific Gravity, Urine <1.005 (L) 1.005 - 1.030   pH 7.0 5.0 - 8.0   Glucose, UA NEGATIVE NEGATIVE mg/dL   Hgb urine dipstick NEGATIVE NEGATIVE   Bilirubin Urine NEGATIVE NEGATIVE   Ketones, ur NEGATIVE NEGATIVE mg/dL   Protein, ur NEGATIVE NEGATIVE mg/dL   Nitrite NEGATIVE NEGATIVE   Leukocytes,Ua NEGATIVE NEGATIVE    MDM UA Cervix closed, non tender  Tylenol  GI Cocktail  Upon reassessment, patient states she is still having the same pain and the pain moves all over her abdomen.   Simethicone  Patient reports complete resolution of pain.  Assessment and Plan   1. Gas pain   2. [redacted] weeks gestation of pregnancy    -Discharge home in stable condition -Rx for simethicone sent to patient's pharmacy -Second trimester precautions discussed -Patient advised to follow-up with OB as scheduled for prenatal care -Patient may return to MAU as needed or if her condition were to change or worsen  06/14/21 CNM 06/12/2021, 10:53 AM

## 2021-06-12 NOTE — Discharge Instructions (Signed)

## 2021-06-12 NOTE — MAU Note (Addendum)
Presents with c/o constant sharp pain located in the mid abdominal region that began this morning.  Reports pain had sudden onset.  Last BM 2 days ago.  Denies VB or LOF.

## 2021-06-17 NOTE — L&D Delivery Note (Signed)
Delivery Note At 10:53 PM a viable female was delivered via Vaginal, Spontaneous (Presentation: Left Occiput Anterior).  APGAR: 8, 9; weight  .   Placenta status: Spontaneous, Intact.  Cord: 3 vessels with the following complications: None.  Cord pH: n/a  Anesthesia: Epidural Episiotomy: None Lacerations: 2nd degree Suture Repair: 2.0 vicryl Est. Blood Loss (mL):  200 cc  Mom to postpartum.  Baby to Couplet care / Skin to Skin.  Purcell Nails 11/22/2021, 11:19 PM

## 2021-08-27 LAB — OB RESULTS CONSOLE GC/CHLAMYDIA
Chlamydia: NEGATIVE
Neisseria Gonorrhea: NEGATIVE

## 2021-11-13 LAB — OB RESULTS CONSOLE GBS: GBS: NEGATIVE

## 2021-11-22 ENCOUNTER — Inpatient Hospital Stay (HOSPITAL_COMMUNITY): Payer: 59 | Admitting: Anesthesiology

## 2021-11-22 ENCOUNTER — Inpatient Hospital Stay (HOSPITAL_COMMUNITY)
Admission: AD | Admit: 2021-11-22 | Discharge: 2021-11-24 | DRG: 805 | Disposition: A | Discharge status: Home / Self Care | Payer: 59 | Attending: Obstetrics & Gynecology | Admitting: Obstetrics & Gynecology

## 2021-11-22 ENCOUNTER — Other Ambulatory Visit: Payer: Self-pay

## 2021-11-22 ENCOUNTER — Encounter (HOSPITAL_COMMUNITY): Payer: Self-pay | Admitting: Obstetrics & Gynecology

## 2021-11-22 DIAGNOSIS — O99844 Bariatric surgery status complicating childbirth: Secondary | ICD-10-CM | POA: Diagnosis present

## 2021-11-22 DIAGNOSIS — O9902 Anemia complicating childbirth: Secondary | ICD-10-CM | POA: Diagnosis present

## 2021-11-22 DIAGNOSIS — G932 Benign intracranial hypertension: Secondary | ICD-10-CM | POA: Diagnosis present

## 2021-11-22 DIAGNOSIS — R030 Elevated blood-pressure reading, without diagnosis of hypertension: Secondary | ICD-10-CM | POA: Diagnosis present

## 2021-11-22 DIAGNOSIS — O99354 Diseases of the nervous system complicating childbirth: Secondary | ICD-10-CM | POA: Diagnosis present

## 2021-11-22 DIAGNOSIS — Z3A38 38 weeks gestation of pregnancy: Secondary | ICD-10-CM

## 2021-11-22 DIAGNOSIS — O2412 Pre-existing diabetes mellitus, type 2, in childbirth: Secondary | ICD-10-CM | POA: Diagnosis present

## 2021-11-22 DIAGNOSIS — O1002 Pre-existing essential hypertension complicating childbirth: Secondary | ICD-10-CM | POA: Diagnosis present

## 2021-11-22 DIAGNOSIS — E119 Type 2 diabetes mellitus without complications: Secondary | ICD-10-CM | POA: Diagnosis present

## 2021-11-22 DIAGNOSIS — O99214 Obesity complicating childbirth: Secondary | ICD-10-CM | POA: Diagnosis present

## 2021-11-22 LAB — TYPE AND SCREEN
ABO/RH(D): A POS
Antibody Screen: NEGATIVE

## 2021-11-22 LAB — COMPREHENSIVE METABOLIC PANEL
ALT: 20 U/L (ref 0–44)
AST: 23 U/L (ref 15–41)
Albumin: 3.2 g/dL — ABNORMAL LOW (ref 3.5–5.0)
Alkaline Phosphatase: 114 U/L (ref 38–126)
Anion gap: 10 (ref 5–15)
BUN: 6 mg/dL (ref 6–20)
CO2: 22 mmol/L (ref 22–32)
Calcium: 9.2 mg/dL (ref 8.9–10.3)
Chloride: 105 mmol/L (ref 98–111)
Creatinine, Ser: 0.77 mg/dL (ref 0.44–1.00)
GFR, Estimated: >60 mL/min (ref >60)
Glucose, Bld: 95 mg/dL (ref 70–99)
Potassium: 4.2 mmol/L (ref 3.5–5.1)
Sodium: 137 mmol/L (ref 135–145)
Total Bilirubin: 0.5 mg/dL (ref 0.3–1.2)
Total Protein: 6.8 g/dL (ref 6.5–8.1)

## 2021-11-22 LAB — OB RESULTS CONSOLE ABO/RH

## 2021-11-22 LAB — GLUCOSE, CAPILLARY: Glucose-Capillary: 87 mg/dL (ref 70–99)

## 2021-11-22 LAB — PROTEIN / CREATININE RATIO, URINE
Creatinine, Urine: 70.51 mg/dL
Protein Creatinine Ratio: 0.28 mg/mg{Cre} — ABNORMAL HIGH (ref 0.00–0.15)
Total Protein, Urine: 20 mg/dL

## 2021-11-22 LAB — CBC
HCT: 34.2 % — ABNORMAL LOW (ref 36.0–46.0)
Hemoglobin: 12.3 g/dL (ref 12.0–15.0)
MCH: 31.1 pg (ref 26.0–34.0)
MCHC: 36 g/dL (ref 30.0–36.0)
MCV: 86.6 fL (ref 80.0–100.0)
Platelets: 303 10*3/uL (ref 150–400)
RBC: 3.95 MIL/uL (ref 3.87–5.11)
RDW: 12.3 % (ref 11.5–15.5)
WBC: 10.9 10*3/uL — ABNORMAL HIGH (ref 4.0–10.5)
nRBC: 0 % (ref 0.0–0.2)

## 2021-11-22 MED ORDER — ONDANSETRON HCL 4 MG/2ML IJ SOLN: 4.0000 mg | Freq: Four times a day (QID) | INTRAMUSCULAR | Status: DC | PRN

## 2021-11-22 MED ORDER — FENTANYL-BUPIVACAINE-NACL 0.5-0.125-0.9 MG/250ML-% EP SOLN
12.0000 mL/h | EPIDURAL | Status: DC | PRN
  Administered 2021-11-22: 12 mL/h via EPIDURAL
  Filled 2021-11-22: qty 250

## 2021-11-22 MED ORDER — DIPHENHYDRAMINE HCL 50 MG/ML IJ SOLN: 12.5000 mg | INTRAMUSCULAR | Status: DC | PRN

## 2021-11-22 MED ORDER — PHENYLEPHRINE 80 MCG/ML (10ML) SYRINGE FOR IV PUSH (FOR BLOOD PRESSURE SUPPORT)
80.0000 ug | PREFILLED_SYRINGE | INTRAVENOUS | Status: DC | PRN
Start: 2021-11-22 — End: 2021-11-23

## 2021-11-22 MED ORDER — EPHEDRINE 5 MG/ML INJ: 10.0000 mg | INTRAVENOUS | Status: DC | PRN

## 2021-11-22 MED ORDER — SOD CITRATE-CITRIC ACID 500-334 MG/5ML PO SOLN: 30.0000 mL | ORAL | Status: DC | PRN

## 2021-11-22 MED ORDER — OXYCODONE-ACETAMINOPHEN 5-325 MG PO TABS: 1.0000 | ORAL_TABLET | ORAL | Status: DC | PRN

## 2021-11-22 MED ORDER — OXYTOCIN-SODIUM CHLORIDE 30-0.9 UT/500ML-% IV SOLN
2.5000 [IU]/h | INTRAVENOUS | Status: DC
  Filled 2021-11-22: qty 500

## 2021-11-22 MED ORDER — LACTATED RINGERS IV SOLN
500.0000 mL | Freq: Once | INTRAVENOUS | Status: AC
  Administered 2021-11-22: 500 mL via INTRAVENOUS

## 2021-11-22 MED ORDER — LACTATED RINGERS IV SOLN: INTRAVENOUS | Status: DC

## 2021-11-22 MED ORDER — FLEET ENEMA 7-19 GM/118ML RE ENEM: 1.0000 | ENEMA | RECTAL | Status: DC | PRN

## 2021-11-22 MED ORDER — LIDOCAINE-EPINEPHRINE (PF) 1.5 %-1:200000 IJ SOLN
INTRAMUSCULAR | Status: DC | PRN
  Administered 2021-11-22: 5 mL via EPIDURAL

## 2021-11-22 MED ORDER — ACETAMINOPHEN 325 MG PO TABS
650.0000 mg | ORAL_TABLET | ORAL | Status: DC | PRN
  Administered 2021-11-22: 650 mg via ORAL
  Filled 2021-11-22: qty 2

## 2021-11-22 MED ORDER — LIDOCAINE HCL (PF) 1 % IJ SOLN
30.0000 mL | INTRAMUSCULAR | Status: AC | PRN
  Administered 2021-11-22: 30 mL via SUBCUTANEOUS
  Filled 2021-11-22: qty 30

## 2021-11-22 MED ORDER — OXYTOCIN BOLUS FROM INFUSION
333.0000 mL | Freq: Once | INTRAVENOUS | Status: AC
  Administered 2021-11-22: 333 mL via INTRAVENOUS

## 2021-11-22 MED ORDER — PHENYLEPHRINE 80 MCG/ML (10ML) SYRINGE FOR IV PUSH (FOR BLOOD PRESSURE SUPPORT): 80.0000 ug | PREFILLED_SYRINGE | INTRAVENOUS | Status: DC | PRN

## 2021-11-22 MED ORDER — LACTATED RINGERS IV SOLN
500.0000 mL | INTRAVENOUS | Status: DC | PRN
  Administered 2021-11-22: 500 mL via INTRAVENOUS

## 2021-11-22 MED ORDER — OXYCODONE-ACETAMINOPHEN 5-325 MG PO TABS: 2.0000 | ORAL_TABLET | ORAL | Status: DC | PRN

## 2021-11-22 NOTE — Anesthesia Procedure Notes (Signed)
Epidural Patient location during procedure: OB Start time: 11/22/2021 2:05 PM End time: 11/22/2021 2:11 PM  Staffing Anesthesiologist: Atilano Median, DO Performed: anesthesiologist   Preanesthetic Checklist Completed: patient identified, IV checked, site marked, risks and benefits discussed, surgical consent, monitors and equipment checked, pre-op evaluation and timeout performed  Epidural Patient position: sitting Prep: ChloraPrep Patient monitoring: heart rate, continuous pulse ox and blood pressure Approach: midline Location: L4-L5 Injection technique: LOR saline  Needle:  Needle type: Tuohy  Needle gauge: 17 G Needle length: 9 cm Needle insertion depth: 9 cm Catheter type: closed end flexible Catheter size: 20 Guage Catheter at skin depth: 14 cm Test dose: negative and 1.5% lidocaine  Assessment Events: blood not aspirated, injection not painful, no injection resistance and no paresthesia  Additional Notes Patient identified. Risks/Benefits/Options discussed with patient including but not limited to bleeding, infection, nerve damage, paralysis, failed block, incomplete pain control, headache, blood pressure changes, nausea, vomiting, reactions to medications, itching and postpartum back pain. Confirmed with bedside nurse the patient's most recent platelet count. Confirmed with patient that they are not currently taking any anticoagulation, have any bleeding history or any family history of bleeding disorders. Patient expressed understanding and wished to proceed. All questions were answered. Sterile technique was used throughout the entire procedure. Please see nursing notes for vital signs. Test dose was given through epidural catheter and negative prior to continuing to dose epidural or start infusion. Warning signs of high block given to the patient including shortness of breath, tingling/numbness in hands, complete motor block, or any concerning symptoms with instructions  to call for help. Patient was given instructions on fall risk and not to get out of bed. All questions and concerns addressed with instructions to call with any issues or inadequate analgesia.    Reason for block:procedure for pain

## 2021-11-22 NOTE — MAU Note (Signed)
Gabriela Cantu is a 31 y.o. at [redacted]w[redacted]d here in MAU reporting: sent over from the office for labor eval, was 1 cm. Contractions started this AM and are every 5-6 min or less. Saw some bleeding after VE in office. Denies LOF. +FM  Onset of complaint: today  Pain score: 10/10  FHT:EFM applied in room  Lab orders placed from triage: none

## 2021-11-22 NOTE — Progress Notes (Addendum)
Gabriela Cantu is a 31 y.o. G1P0000 at [redacted]w[redacted]d   Subjective: Feels pressure  Objective: BP 122/75   Pulse 86   Temp 98.8 F (37.1 C) (Oral)   Resp 16   Ht 5\' 3"  (1.6 m)   Wt 115.7 kg   LMP 02/26/2021   SpO2 100%   BMI 45.17 kg/m  No intake/output data recorded. Total I/O In: -  Out: 825 [Urine:825]  FHT:  FHR: 150 bpm, variability: moderate,  accelerations:  Present,  decelerations:  Absent UC:   regular, every 2-3 minutes SVE:   Dilation: Lip/rim Effacement (%): 100 Station: 0 Exam by:: Dr. [email or web address]  Labs: Lab Results  Component Value Date   WBC 10.9 (H) 11/22/2021   HGB 12.3 11/22/2021   HCT 34.2 (L) 11/22/2021   MCV 86.6 11/22/2021   PLT 303 11/22/2021   CBG 87  Assessment / Plan: P0 at 30 3/7wks   Labor: Progressing normally Preeclampsia:  no signs or symptoms of toxicity Fetal Wellbeing:  Category I Pain Control:  Epidural I/D:   GBS neg Anticipated MOD:  NSVD  01/22/2022 11/22/2021, 8:50 PM

## 2021-11-22 NOTE — Progress Notes (Signed)
FD/0 Trial of pushing without significant movement Will labor down and recheck Fetal tachycardia developed after pushing started with moderate variability and accels, no decels Temp 100.1 Tylenol and fluid bolus given UOP 825cc/6hrs

## 2021-11-22 NOTE — H&P (Addendum)
OB ADMISSION HISTORY & PHYSICAL  Admission Date: 11/22/2021 12:39 PM  Admit Diagnosis: active labor, term pregnancy, elevated BP w/o diagnosis of hypertension  Gabriela Cantu is a 31 y.o. female G1P0000 [redacted]w[redacted]d presenting from office for painful regular contractions. Endorses active FM, denies LOF and vaginal bleeding. Ctx began this morning  History of current pregnancy: G1P0000   Prenatal Care with: [facility] Patient entered prenatal care at 9 wks.   EDC 12/03/21 by LMP of 02/26/21 and congruent w/ 9 wk U/S.   Anatomy scan:  22 wks, complete w/ anterior placenta.   Antenatal testing: for BMI started at 36 weeks  Significant prenatal problems: H/O benign intracranial hypertension - has ventricular shunt Anemia of pregnancy Maternal obesity complicating pregnancy Upon review of Epic historic notes: Patient with history of  Essential Hypertension Type 2 Diabetes Mellitus H/O Roux Y Gastric bypass           Prenatal Labs: ABO, Rh:  A pos Antibody:  neg Rubella:   Immune RPR:   NR HBsAg:   NR HIV:   Neg 1 HR GCT: PASS  GBS:   NEG GC/CHL: Negative Genetics: Declined MSAFP only neg Vaccines: Tdap: Y Flu declined   Prenatal Transfer Tool  Maternal Diabetes: No Genetic Screening: Declined Maternal Ultrasounds/Referrals: Normal Fetal Ultrasounds or other Referrals:  None Maternal Substance Abuse:  No Significant Maternal Medications:  None Significant Maternal Lab Results:  Group B Strep negative Other Comments:  None  OB History  Gravida Para Term Preterm AB Living  1 0 0 0 0 0  SAB IAB Ectopic Multiple Live Births  0 0 0 0      # Outcome Date GA Lbr Len/2nd Weight Sex Delivery Anes PTL Lv  1 Current             Medical / Surgical History: Past medical history:  Past Medical History:  Diagnosis Date   Diabetes mellitus without complication ([place])    Hypertension    Morbid obesity ([place])    Pseudotumor cerebri     Past surgical history:  Past Surgical History:   Procedure Laterality Date   GASTRIC BYPASS     LAPAROSCOPIC REVISION VENTRICULAR-PERITONEAL (V-P) SHUNT Left 07/28/2012   Procedure: LAPAROSCOPIC REVISION VENTRICULAR-PERITONEAL (V-P) SHUNT;  Surgeon: Odis Hollingshead, MD;  Location: [facility];  Service: General;  Laterality: Left;  Ventricular-peritoneal shunt placement with Dr. Zella Richer   VENTRICULOPERITONEAL SHUNT N/A 07/28/2012   Procedure: SHUNT INSERTION VENTRICULAR-PERITONEAL;  Surgeon: Erline Levine, MD;  Location: [facility];  Service: Neurosurgery;  Laterality: N/A;  Ventricular-peritoneal shunt placement with Dr. Zella Richer   Family History:  Family History  Problem Relation Age of Onset   Hypertension Mother    Kidney disease Mother    Hypertension Maternal Aunt    Hypertension Maternal Grandmother    Hypertension Maternal Grandfather     Social History:  reports that she has never smoked. She has never used smokeless tobacco. She reports that she does not currently use alcohol. She reports that she does not use drugs.  Allergies: Patient has no known allergies.   Current Medications at time of admission:  Prior to Admission medications   Medication Sig Start Date End Date Taking? Authorizing Provider  CALCIUM PO Take 1 tablet by mouth daily.    [provider]  Cholecalciferol (VITAMIN D3 PO) Take 2 tablets by mouth daily.    [provider]  Cyanocobalamin (VITAMIN B-12 PO) Take by mouth.    [provider]  Misc. Devices MISC CPAP therapy at 10 cm. water pressure with EPR 3.  CPAP machine with a mask and supplies per patient preference for OSA.  ResMed AirFit P10(XS) nasal pillow mask.  Send to a DME in [place]. 12/16/19   [provider]  Multiple Vitamin (MULTIVITAMIN) capsule Take 1 capsule by mouth daily.    [provider]  Prenatal Vit-Fe Fumarate-FA (MULTIVITAMIN-PRENATAL) 27-0.8 MG TABS tablet Take 1 tablet by mouth daily at 12 noon.    [provider]   simethicone (GAS-X) 80 MG chewable tablet Chew 1 tablet (80 mg total) by mouth every 6 (six) hours as needed for flatulence. 06/12/21   Wende Mott, CNM    Review of Systems: Constitutional: Negative   HENT: Negative   Eyes: Negative   Respiratory: Negative   Cardiovascular: Negative   Gastrointestinal: Negative  Genitourinary: neg for bloody show, neg for LOF   Musculoskeletal: Negative   Skin: Negative   Neurological: Negative   Endo/Heme/Allergies: Negative   Psychiatric/Behavioral: Negative    Physical Exam: VS: Blood pressure (!) 154/90, pulse 100, temperature 98.1 F (36.7 C), temperature source Oral, resp. rate 18, height 5\' 3"  (1.6 m), weight 115.7 kg, last menstrual period 02/26/2021, SpO2 97 %. AAO x3, no signs of distress Cardiovascular: RRR Respiratory: Lung fields clear to ausculation GU/GI: Abdomen gravid, non-tender, non-distended, active FM, vertex, EFW 3200 grams per Leopold's Extremities: neg edema, negative for pain, tenderness, and cords  Cervical exam:Dilation: 4 Effacement (%): 90 Station: -1 Exam by:: n druebbisch rn FHR: baseline rate 145 / variability moderate / accelerations present / absent decelerations TOCO: q4  Most recent growth ultrasound 11/13/21: SIUP vertex anterior placenta normal AFI lagging BPD 2% HC 5% EFW 3024 grams 6# 11 oz 46%       Assessment: 31 y.o. G1P0000 [redacted]w[redacted]d admitted for active labor, and elevated BP.  Patient w/o signs or sx of preeclampsia  Entering 2nd stage of labor FHR category 1 GBS neg Pain management plan: Epidural   Plan:  Admit to Labor and Delivery Routine admission orders Epidural on maternal demand IV hydration Continuous monitoring Pitocin prn for labor augmentation Pre E labs including PCR in addition to the admit orders  Anticipate NSVD delivery   Sanjuana Kava MD 11/22/2021 1:56 PM

## 2021-11-22 NOTE — Progress Notes (Signed)
Patient ID: Gabriela Cantu, female   DOB: 08-May-1991, 31 y.o.   MRN: [id number]  Patient with past history of Chronic hypertension and Type 2 Diabetes Mellitus which  She reports "resolved" with gastric bypass surgery and she was not on any anti-hypertensive Medications, nor was she on any diabetic medications.   Her Intracranial hypertension was from a diagnosis of pseudotumor cerebri.   Naoma Diener Toneisha Savary

## 2021-11-22 NOTE — Progress Notes (Signed)
MD LABOR PROGRESS NOTE- Brief remote strip review  Gabriela Cantu is a 31 y.o. G1P0000 at [redacted]w[redacted]d admitted for active labor  Subjective: Patient comfortable with epidural  Objective: BP 122/73   Pulse 93   Temp 98.1 F (36.7 C) (Oral)   Resp 18   Ht 5\' 3"  (1.6 m)   Wt 115.7 kg   LMP 02/26/2021   SpO2 100%   BMI 45.17 kg/m  No intake/output data recorded.   FHT:  FHR: 145 bpm, variability: moderate,  accelerations:  Present,  decelerations:  Absent UC:   irregular, every 2=5 minutes SVE:   Dilation: 7 Effacement (%): 100 Station: -2, -3 Exam by:: Mary Martinique Johnson, RNC-OB and [place], [place]  Labs: Lab Results  Component Value Date   WBC 10.9 (H) 11/22/2021   HGB 12.3 11/22/2021   HCT 34.2 (L) 11/22/2021   MCV 86.6 11/22/2021   PLT 303 11/22/2021   CMP     Component Value Date/Time   NA 137 11/22/2021 1329   K 4.2 11/22/2021 1329   CL 105 11/22/2021 1329   CO2 22 11/22/2021 1329   GLUCOSE 95 11/22/2021 1329   BUN 6 11/22/2021 1329   CREATININE 0.77 11/22/2021 1329   CREATININE 0.83 08/22/2015 1428   CALCIUM 9.2 11/22/2021 1329   PROT 6.8 11/22/2021 1329   ALBUMIN 3.2 (L) 11/22/2021 1329   AST 23 11/22/2021 1329   ALT 20 11/22/2021 1329   ALKPHOS 114 11/22/2021 1329   BILITOT 0.5 11/22/2021 1329   GFRNONAA >60 11/22/2021 1329   GFRAA >60 06/11/2018 1313    PCR:  0.28 Assessment / Plan: Spontaneous labor, progressing normally. Blood pressures normalized PRE E labs wnl  Labor: Progressing normally Fetal Wellbeing:  Category I Pain Control:  Epidural I/D:  n/a Anticipated MOD:  NSVD  Sanjuana Kava MD 11/22/2021, 5:17 PM

## 2021-11-22 NOTE — Progress Notes (Signed)
Handoff Summary 31 yo G1P0 at 38 weeks 3 days admitted in early / active labor (4-5cm) no lof, no vb, endorses FM. GBS negative.  -Morbid obesity BMI 42 -initial elevated BPs no sx Pre E labs neg -Past h/o type 2 DM/ CHTN - per pt resolved with gastric bypass -Has ventricular shunt for benign intracranial hypertension - neuro ok for push Has epidural Progressed to 7cm no pit Fetal wellbeing: Category 1        To Do AROM []  Deliver []  Check cbgs fasting []  CMP / CBC for am  []    Labs Labs: WBC/Hgb/Hct/Plts:  10.9/12.3/34.2/303 (06/08 1329) BUN/Cr/glu/ALT/AST/amyl/lip:  6/0.77/--/20/23/--/-- (06/08 1329)   Na/K/Cl/CO2:  137/4.2/105/22 (06/08 1329) Recent Results (from the past 720 hour(s))  OB RESULT CONSOLE Group B Strep     Status: None   Collection Time: 11/13/21 12:00 AM  Result Value Ref Range Status   GBS Negative  Final        Medications Medications:  oxytocin 40 units in LR 1000 mL  333 mL Intravenous Once   acetaminophen, diphenhydrAMINE, ePHEDrine, ePHEDrine, fentaNYL 2 mcg/mL w/bupivacaine 0.125% in NS 250 mL, lactated ringers, lidocaine (PF), ondansetron, oxyCODONE-acetaminophen, oxyCODONE-acetaminophen, phenylephrine, phenylephrine, sodium citrate-citric acid, sodium phosphate Allergies: Patient has no known allergies.

## 2021-11-22 NOTE — Anesthesia Preprocedure Evaluation (Addendum)
Anesthesia Evaluation  Patient identified by MRN, date of birth, ID band Patient awake    Reviewed: Allergy & Precautions, NPO status , Patient's Chart, lab work & pertinent test results  Airway Mallampati: II  TM Distance: >3 FB Neck ROM: Full    Dental no notable dental hx.    Pulmonary neg pulmonary ROS,    Pulmonary exam normal        Cardiovascular hypertension,  Rhythm:Regular Rate:Normal     Neuro/Psych negative neurological ROS  negative psych ROS   GI/Hepatic negative GI ROS, Neg liver ROS,   Endo/Other  diabetesMorbid obesity  Renal/GU   negative genitourinary   Musculoskeletal negative musculoskeletal ROS (+)   Abdominal Normal abdominal exam  (+)   Peds  Hematology negative hematology ROS (+)   Anesthesia Other Findings   Reproductive/Obstetrics (+) Pregnancy                            Anesthesia Physical Anesthesia Plan  ASA: 3  Anesthesia Plan: Epidural   Post-op Pain Management:    Induction:   PONV Risk Score and Plan: 2 and Treatment may vary due to age or medical condition  Airway Management Planned: Natural Airway  Additional Equipment: None  Intra-op Plan:   Post-operative Plan:   Informed Consent: I have reviewed the patients History and Physical, chart, labs and discussed the procedure including the risks, benefits and alternatives for the proposed anesthesia with the patient or authorized representative who has indicated his/her understanding and acceptance.     Dental advisory given  Plan Discussed with:   Anesthesia Plan Comments: (Lab Results      Component                Value               Date                      WBC                      10.9 (H)            11/22/2021                HGB                      12.3                11/22/2021                HCT                      34.2 (L)            11/22/2021                MCV                       86.6                11/22/2021                PLT                      303                 11/22/2021          )  Anesthesia Quick Evaluation  

## 2021-11-23 ENCOUNTER — Encounter (HOSPITAL_COMMUNITY): Payer: Self-pay | Admitting: Obstetrics & Gynecology

## 2021-11-23 LAB — COMPREHENSIVE METABOLIC PANEL
ALT: 17 U/L (ref 0–44)
AST: 26 U/L (ref 15–41)
Albumin: 2.2 g/dL — ABNORMAL LOW (ref 3.5–5.0)
Alkaline Phosphatase: 80 U/L (ref 38–126)
Anion gap: 5 (ref 5–15)
BUN: 5 mg/dL — ABNORMAL LOW (ref 6–20)
CO2: 20 mmol/L — ABNORMAL LOW (ref 22–32)
Calcium: 8.3 mg/dL — ABNORMAL LOW (ref 8.9–10.3)
Chloride: 111 mmol/L (ref 98–111)
Creatinine, Ser: 0.78 mg/dL (ref 0.44–1.00)
GFR, Estimated: >60 mL/min (ref >60)
Glucose, Bld: 139 mg/dL — ABNORMAL HIGH (ref 70–99)
Potassium: 3.5 mmol/L (ref 3.5–5.1)
Sodium: 136 mmol/L (ref 135–145)
Total Bilirubin: 0.6 mg/dL (ref 0.3–1.2)
Total Protein: 4.6 g/dL — ABNORMAL LOW (ref 6.5–8.1)

## 2021-11-23 LAB — CBC
HCT: 25.5 % — ABNORMAL LOW (ref 36.0–46.0)
Hemoglobin: 9.1 g/dL — ABNORMAL LOW (ref 12.0–15.0)
MCH: 31.2 pg (ref 26.0–34.0)
MCHC: 35.7 g/dL (ref 30.0–36.0)
MCV: 87.3 fL (ref 80.0–100.0)
Platelets: 229 10*3/uL (ref 150–400)
RBC: 2.92 MIL/uL — ABNORMAL LOW (ref 3.87–5.11)
RDW: 12.2 % (ref 11.5–15.5)
WBC: 13.4 10*3/uL — ABNORMAL HIGH (ref 4.0–10.5)
nRBC: 0 % (ref 0.0–0.2)

## 2021-11-23 LAB — RPR: RPR Ser Ql: NONREACTIVE

## 2021-11-23 MED ORDER — TETANUS-DIPHTH-ACELL PERTUSSIS 5-2.5-18.5 LF-MCG/0.5 IM SUSY: 0.5000 mL | PREFILLED_SYRINGE | Freq: Once | INTRAMUSCULAR | Status: DC

## 2021-11-23 MED ORDER — ZOLPIDEM TARTRATE 5 MG PO TABS: 5.0000 mg | ORAL_TABLET | Freq: Every evening | ORAL | Status: DC | PRN

## 2021-11-23 MED ORDER — WITCH HAZEL-GLYCERIN EX PADS
1.0000 "application " | MEDICATED_PAD | CUTANEOUS | Status: DC | PRN
  Administered 2021-11-24: 1 via TOPICAL

## 2021-11-23 MED ORDER — OXYCODONE HCL 5 MG PO TABS
10.0000 mg | ORAL_TABLET | ORAL | Status: DC | PRN
  Filled 2021-11-23: qty 2

## 2021-11-23 MED ORDER — BENZOCAINE-MENTHOL 20-0.5 % EX AERO
1.0000 "application " | INHALATION_SPRAY | CUTANEOUS | Status: DC | PRN
  Administered 2021-11-23: 1 via TOPICAL
  Filled 2021-11-23 (×2): qty 56

## 2021-11-23 MED ORDER — SENNOSIDES-DOCUSATE SODIUM 8.6-50 MG PO TABS
2.0000 | ORAL_TABLET | Freq: Every day | ORAL | Status: DC
  Administered 2021-11-23 – 2021-11-24 (×2): 2 via ORAL
  Filled 2021-11-23 (×2): qty 2

## 2021-11-23 MED ORDER — OXYTOCIN-SODIUM CHLORIDE 30-0.9 UT/500ML-% IV SOLN: 2.5000 [IU]/h | INTRAVENOUS | Status: DC | PRN

## 2021-11-23 MED ORDER — IBUPROFEN 600 MG PO TABS
600.0000 mg | ORAL_TABLET | Freq: Four times a day (QID) | ORAL | Status: DC
  Filled 2021-11-23: qty 1

## 2021-11-23 MED ORDER — COCONUT OIL OIL: 1.0000 "application " | TOPICAL_OIL | Status: DC | PRN

## 2021-11-23 MED ORDER — FERROUS SULFATE 325 (65 FE) MG PO TBEC: 325.0000 mg | DELAYED_RELEASE_TABLET | Freq: Every day | ORAL | 2 refills | Status: AC

## 2021-11-23 MED ORDER — OXYCODONE HCL 5 MG PO TABS
5.0000 mg | ORAL_TABLET | ORAL | Status: DC | PRN
  Administered 2021-11-23 – 2021-11-24 (×6): 5 mg via ORAL
  Filled 2021-11-23 (×6): qty 1

## 2021-11-23 MED ORDER — ONDANSETRON HCL 4 MG/2ML IJ SOLN: 4.0000 mg | INTRAMUSCULAR | Status: DC | PRN

## 2021-11-23 MED ORDER — ONDANSETRON HCL 4 MG PO TABS: 4.0000 mg | ORAL_TABLET | ORAL | Status: DC | PRN

## 2021-11-23 MED ORDER — IBUPROFEN 600 MG PO TABS: 600.0000 mg | ORAL_TABLET | Freq: Four times a day (QID) | ORAL | 0 refills | Status: AC

## 2021-11-23 MED ORDER — DIBUCAINE (PERIANAL) 1 % EX OINT: 1.0000 "application " | TOPICAL_OINTMENT | CUTANEOUS | Status: DC | PRN

## 2021-11-23 MED ORDER — ACETAMINOPHEN 325 MG PO TABS
650.0000 mg | ORAL_TABLET | ORAL | Status: DC | PRN
  Administered 2021-11-23 – 2021-11-24 (×6): 650 mg via ORAL
  Filled 2021-11-23 (×6): qty 2

## 2021-11-23 MED ORDER — SIMETHICONE 80 MG PO CHEW: 80.0000 mg | CHEWABLE_TABLET | ORAL | Status: DC | PRN

## 2021-11-23 MED ORDER — DIPHENHYDRAMINE HCL 25 MG PO CAPS: 25.0000 mg | ORAL_CAPSULE | Freq: Four times a day (QID) | ORAL | Status: DC | PRN

## 2021-11-23 MED ORDER — PRENATAL MULTIVITAMIN CH
1.0000 | ORAL_TABLET | Freq: Every day | ORAL | Status: DC
  Administered 2021-11-23 – 2021-11-24 (×2): 1 via ORAL
  Filled 2021-11-23 (×2): qty 1

## 2021-11-23 NOTE — Progress Notes (Signed)
Post Partum Day 1 Subjective: no complaints, up ad lib, voiding, and tolerating PO  Objective: Blood pressure 114/76, pulse 92, temperature 97.7 F (36.5 C), temperature source Oral, resp. rate 18, height 5\' 3"  (1.6 m), weight 115.7 kg, last menstrual period 02/26/2021, SpO2 97 %, unknown if currently breastfeeding.  Physical Exam:  General: alert, cooperative, and no distress Lochia: appropriate Uterine Fundus: firm Incision: None.  DVT Evaluation: No evidence of DVT seen on physical exam.  Recent Labs    11/22/21 1329 11/23/21 0350  HGB 12.3 9.1*  HCT 34.2* 25.5*    Assessment/Plan: Plan for discharge tomorrow, Breastfeeding, and Lactation consult For iron tab use at discharge.   LOS: 1 day   01/23/22, MD.  11/23/2021, 5:37 PM

## 2021-11-23 NOTE — Anesthesia Postprocedure Evaluation (Signed)
Anesthesia Post Note  Patient: Gabriela Cantu  Procedure(s) Performed: AN AD HOC LABOR EPIDURAL     Patient location during evaluation: Mother Baby Anesthesia Type: Epidural Level of consciousness: awake Pain management: satisfactory to patient Vital Signs Assessment: post-procedure vital signs reviewed and stable Respiratory status: spontaneous breathing Cardiovascular status: stable Anesthetic complications: no   No notable events documented.  Last Vitals:  Vitals:   11/23/21 0605 11/23/21 1000  BP: 128/80 124/83  Pulse: 100 95  Resp: 18 18  Temp: 36.9 C 36.7 C  SpO2: 99% 99%    Last Pain:  Vitals:   11/23/21 0728  TempSrc:   PainSc: 5    Pain Goal:                   Cephus Shelling

## 2021-11-23 NOTE — Lactation Note (Signed)
This note was copied from a baby's chart. Lactation Consultation Note  Patient Name: Gabriela Cantu Today's Date: 11/23/2021 Reason for consult: Follow-up assessment;Mother's request;Difficult latch;1st time breastfeeding;Early term 37-38.6wks;Infant weight loss (-1% weight loss, mom's feeding choice is breast and formula feeding infant.) Age:30 hours P1, ETI female infant. Mom requested latch assistance from [facility] tonight. Per mom, she used DEBP once today, mom prefers just to use hand pump instead in breastfeeding kit.  Mom latched infant on her left breast using the football hold position, infant sustained latch and was still breastfeeding after 16 minutes when LC left the room. Mom plans to latch infant for every feeding going forward now that infant is sustaining latch and feeding at the breast. Mom plans to continue supplementing infant with formula after latching her at the breast ( this is mom's choice). Mom will continue to breastfeed infant by cues, on demand, 8 to 12+ times within 24 hours, skin to skin. Mom knows to continue to ask R/LC for latch assistance if needed.  Maternal Data    Feeding Mother's Current Feeding Choice: Breast Milk and Formula Nipple Type: Slow - flow  LATCH Score Latch: Grasps breast easily, tongue down, lips flanged, rhythmical sucking.  Audible Swallowing: A few with stimulation  Type of Nipple: Everted at rest and after stimulation  Comfort (Breast/Nipple): Soft / non-tender  Hold (Positioning): Assistance needed to correctly position infant at breast and maintain latch.  LATCH Score: 8   Lactation Tools Discussed/Used    Interventions Interventions: Skin to skin;Assisted with latch;Breast compression;Adjust position;Support pillows;Position options;Education  Discharge    Consult Status Consult Status: Follow-up Date: 11/24/21 Follow-up type: In-patient    Vicente Serene 11/23/2021, 11:56 PM

## 2021-11-23 NOTE — Lactation Note (Signed)
This note was copied from a baby's chart. Lactation Consultation Note  Patient Name: Girl [place] Lance Today's Date: 11/23/2021   Age:30 hours Per RN Corrie Dandy) mom would like to be seen on MBU instead of L&D.  Maternal Data    Feeding    LATCH Score                    Lactation Tools Discussed/Used    Interventions    Discharge    Consult Status      Danelle Earthly 11/23/2021, 12:12 AM

## 2021-11-23 NOTE — Lactation Note (Signed)
This note was copied from a baby's chart. Lactation Consultation Note  Patient Name: Gabriela Cantu Today's Date: 11/23/2021 Reason for consult: Initial assessment;1st time breastfeeding;Early term 37-38.6wks Age:30 hours Mom's feeding choice is breast and formula feeding. LC entered room, mom want LC assistance with latch. Mom latched infant on her left breast using the football hold position, infant was on and off the breast initially but after 5 minutes infant started sustaining the latch, was still breastfeeding after 12 minutes when LC left the room.  Mom knows to ask RN/LC for further latch assistance if needed. Mom will continue to breastfeed infant according to hunger cues, on demand, 8 to 12+ times within 24 hours, skin to skin. Mom made aware of O/P services, breastfeeding support groups, community resources, and our phone # for post-discharge questions.   Maternal Data Has patient been taught Hand Expression?: Yes Does the patient have breastfeeding experience prior to this delivery?: No  Feeding Mother's Current Feeding Choice: Breast Milk and Formula  LATCH Score Latch: Repeated attempts needed to sustain latch, nipple held in mouth throughout feeding, stimulation needed to elicit sucking reflex.  Audible Swallowing: A few with stimulation  Type of Nipple: Everted at rest and after stimulation  Comfort (Breast/Nipple): Soft / non-tender  Hold (Positioning): Assistance needed to correctly position infant at breast and maintain latch.  LATCH Score: 7   Lactation Tools Discussed/Used    Interventions Interventions: Breast feeding basics reviewed;Assisted with latch;Skin to skin;Breast compression;Adjust position;Support pillows;Position options;Education;LC Services brochure  Discharge    Consult Status Consult Status: Follow-up Date: 11/23/21 Follow-up type: In-patient    Danelle Earthly 11/23/2021, 2:39 AM

## 2021-11-24 MED ORDER — OXYCODONE HCL 5 MG PO TABS: 5.0000 mg | ORAL_TABLET | ORAL | 0 refills | Status: AC | PRN

## 2021-11-24 MED ORDER — OXYCODONE HCL 5 MG PO TABS: 5.0000 mg | ORAL_TABLET | ORAL | 0 refills | Status: DC | PRN

## 2021-11-24 NOTE — Lactation Note (Signed)
This note was copied from a baby's chart. Lactation Consultation Note  Patient Name: Gabriela Cantu Today's Date: 11/24/2021 Reason for consult: Follow-up assessment Age:30 hours   P1 mother whose infant is now 87 hours old.  This is an early term infant at 38+3 weeks.  Mother's current feeding preference is breast/formula.  Mother had no questions/concerns related to breast feeding.  She plans to continue latching prior to giving any formula supplementation at home.  Last LATCH score was an 8; baby is voiding/stooling.  Family awaiting discharge.  They have our OP phone number for any questions/concerns after discharge.  Discussed pumping at home as mother will return to work in 7-8 weeks.  Father present.   Maternal Data    Feeding    LATCH Score                    Lactation Tools Discussed/Used    Interventions Interventions: Education;Breast feeding basics reviewed  Discharge Discharge Education: Engorgement and breast care Pump: Personal  Consult Status Consult Status: Complete Date: 11/24/21 Follow-up type: Call as needed    Irene Pap Pearl Berlinger 11/24/2021, 12:35 PM

## 2021-11-24 NOTE — Discharge Summary (Signed)
Postpartum Discharge Summary    Patient Name: Gabriela Cantu DOB: 1990-09-03 MRN: [id number]  Date of admission: 11/22/2021 Delivery date:11/22/2021  Delivering provider: Everett Graff  Date of discharge: 11/24/2021  Admitting diagnosis: Normal labor and delivery [O80] Intrauterine pregnancy: [redacted]w[redacted]d     Secondary diagnosis:  Principal Problem:   Normal labor and delivery Pseudotumor cerebri with  shunt in place.  Chronic HTN not on any medication. Type 2 DM not on any medication.  BMI of 45.     Discharge diagnosis: Term Pregnancy Delivered     Pseudotumor cerebri with  shunt in place.  Chronic HTN not on any medication. Type 2 DM not on any medication.                                           Augmentation: N/A Complications: None  Hospital course: Onset of Labor With Vaginal Delivery      31 y.o. yo G1P1001 at [redacted]w[redacted]d was admitted in Latent Labor on 11/22/2021. Patient had an uncomplicated labor course as follows:  Membrane Rupture Time/Date: 8:28 PM ,11/22/2021   Delivery Method:Vaginal, Spontaneous  Episiotomy: None  Lacerations:  2nd degree;Vaginal;Perineal  Patient had an uncomplicated postpartum course.  She is ambulating, tolerating a regular diet, passing flatus, and urinating well. Patient is discharged home in stable condition on 11/24/21.  Newborn Data: Birth date:11/22/2021  Birth time:10:53 PM  Gender:Female  Living status:Living  Apgars:8 ,9  Weight:2910 g   Physical exam  Vitals:   11/23/21 1000 11/23/21 1518 11/23/21 2121 11/24/21 0518  BP: 124/83 114/76 124/84 113/84  Pulse: 95 92 (!) 115 96  Resp: 18 18 18 18   Temp: 98 F (36.7 C) 97.7 F (36.5 C) 98 F (36.7 C) 98.5 F (36.9 C)  TempSrc:  Oral Oral Oral  SpO2: 99% 97% 99% 99%  Weight:      Height:       General: alert, cooperative, and no distress Lochia: appropriate Uterine Fundus: firm Incision: N/A DVT Evaluation: No evidence of DVT seen on physical exam. Calf/Ankle edema is present, 1+  bilaterally.  Labs: Lab Results  Component Value Date   WBC 13.4 (H) 11/23/2021   HGB 9.1 (L) 11/23/2021   HCT 25.5 (L) 11/23/2021   MCV 87.3 11/23/2021   PLT 229 11/23/2021   CBC    Component Value Date/Time   WBC 13.4 (H) 11/23/2021 0350   RBC 2.92 (L) 11/23/2021 0350   HGB 9.1 (L) 11/23/2021 0350   HCT 25.5 (L) 11/23/2021 0350   PLT 229 11/23/2021 0350   MCV 87.3 11/23/2021 0350   MCV 83.3 09/19/2017 1548   MCH 31.2 11/23/2021 0350   MCHC 35.7 11/23/2021 0350   RDW 12.2 11/23/2021 0350   LYMPHSABS 4.2 (H) 07/08/2012 1853   MONOABS 0.7 07/08/2012 1853   EOSABS 0.3 07/08/2012 1853   BASOSABS 0.0 07/08/2012 1853   CMP     Component Value Date/Time   NA 136 11/23/2021 0350   K 3.5 11/23/2021 0350   CL 111 11/23/2021 0350   CO2 20 (L) 11/23/2021 0350   GLUCOSE 139 (H) 11/23/2021 0350   BUN 5 (L) 11/23/2021 0350   CREATININE 0.78 11/23/2021 0350   CREATININE 0.83 08/22/2015 1428   CALCIUM 8.3 (L) 11/23/2021 0350   PROT 4.6 (L) 11/23/2021 0350   ALBUMIN 2.2 (L) 11/23/2021 0350   AST 26 11/23/2021 0350  ALT 17 11/23/2021 0350   ALKPHOS 80 11/23/2021 0350   BILITOT 0.6 11/23/2021 0350   GFRNONAA >60 11/23/2021 0350   GFRAA >60 06/11/2018 1313    Contains abnormal data Protein / creatinine ratio, urine Order: [id number] Status: Final result    Visible to patient: Yes (not seen)    Next appt: None    0 Result Notes     Component Ref Range & Units 2 d ago  Creatinine, Urine mg/dL 70.51   Total Protein, Urine mg/dL 20   Comment: NO NORMAL RANGE ESTABLISHED FOR THIS TEST  Protein Creatinine Ratio 0.00 - 0.15 mg/mg Cre 0.28 High           Latest Ref Rng & Units 11/23/2021    3:50 AM  CMP  Glucose 70 - 99 mg/dL 139   BUN 6 - 20 mg/dL 5   Creatinine 0.44 - 1.00 mg/dL 0.78   Sodium 135 - 145 mmol/L 136   Potassium 3.5 - 5.1 mmol/L 3.5   Chloride 98 - 111 mmol/L 111   CO2 22 - 32 mmol/L 20   Calcium 8.9 - 10.3 mg/dL 8.3   Total Protein 6.5 - 8.1 g/dL 4.6    Total Bilirubin 0.3 - 1.2 mg/dL 0.6   Alkaline Phos 38 - 126 U/L 80   AST 15 - 41 U/L 26   ALT 0 - 44 U/L 17    Glucose, capillary Order: [id number] Status: Final result    Visible to patient: Yes (not seen)    Next appt: None    0 Result Notes      Component Ref Range & Units 2 d ago 9 yr ago  Glucose-Capillary 70 - 99 mg/dL 87        Edinburgh Score:    11/23/2021   12:18 PM  Edinburgh Postnatal Depression Scale Screening Tool  I have been able to laugh and see the funny side of things. 0  I have looked forward with enjoyment to things. 0  I have blamed myself unnecessarily when things went wrong. 1  I have been anxious or worried for no good reason. 0  I have felt scared or panicky for no good reason. 0  Things have been getting on top of me. 0  I have been so unhappy that I have had difficulty sleeping. 0  I have felt sad or miserable. 0  I have been so unhappy that I have been crying. 0  The thought of harming myself has occurred to me. 0  Edinburgh Postnatal Depression Scale Total 1     After visit meds:  Allergies as of 11/24/2021   No Known Allergies      Medication List     STOP taking these medications    multivitamin capsule       TAKE these medications    CALCIUM PO Take 1 tablet by mouth daily.   ferrous sulfate 325 (65 FE) MG EC tablet Take 1 tablet (325 mg total) by mouth daily with breakfast. What changed: when to take this   ibuprofen 600 MG tablet Commonly known as: ADVIL Take 1 tablet (600 mg total) by mouth every 6 (six) hours.   Misc. Devices Misc CPAP therapy at 10 cm. water pressure with EPR 3.  CPAP machine with a mask and supplies per patient preference for OSA.  ResMed AirFit P10(XS) nasal pillow mask.  Send to a DME in [place].   multivitamin-prenatal 27-0.8 MG Tabs tablet Take 1 tablet by mouth daily  at 12 noon.   oxyCODONE 5 MG immediate release tablet Commonly known as: Oxy IR/ROXICODONE Take 1 tablet (5 mg total)  by mouth every 4 (four) hours as needed (pain scale 4-7).   oxyCODONE 5 MG immediate release tablet Commonly known as: Roxicodone Take 1 tablet (5 mg total) by mouth every 4 (four) hours as needed for severe pain or breakthrough pain.   simethicone 80 MG chewable tablet Commonly known as: Gas-X Chew 1 tablet (80 mg total) by mouth every 6 (six) hours as needed for flatulence.   VITAMIN B-12 PO Take by mouth.   VITAMIN D3 PO Take 2 tablets by mouth daily.        Discharge home in stable condition Infant Feeding: Bottle and Breast Infant Disposition:home with mother Discharge instruction: per After Visit Summary and Postpartum booklet. Activity: Advance as tolerated. Pelvic rest for 6 weeks.  Diet: routine diet Follow up Visit:  [facility]. Schedule an appointment as soon as possible for a visit in 6 week(s).   Specialty: Obstetrics and Gynecology Why: Postpartum check. Contact information: [address] [place]. [address] [place] [phone]               Anticipated Birth Control:  Marena Chancy   11/24/2021 Archie Endo, MD

## 2021-12-01 ENCOUNTER — Telehealth (HOSPITAL_COMMUNITY): Payer: Self-pay

## 2021-12-01 NOTE — Telephone Encounter (Signed)
"  I'm good. Everything is going smooth." Patient declines questions or concerns about her healing.  "She's good. We have a weight check with the pediatrician on Monday. Her 1st appointment was good. She sleeps in a bassinet." RN reviewed ABC's of safe sleep with patient. Patient declines any questions or concerns about baby.  EPDS score is 2.  Marcelino Duster [facility] 06/17//2023,1458

## 2022-08-23 DIAGNOSIS — E1165 Type 2 diabetes mellitus with hyperglycemia: Secondary | ICD-10-CM | POA: Diagnosis not present

## 2022-08-23 DIAGNOSIS — E782 Mixed hyperlipidemia: Secondary | ICD-10-CM | POA: Diagnosis not present

## 2022-08-23 DIAGNOSIS — Z9884 Bariatric surgery status: Secondary | ICD-10-CM | POA: Diagnosis not present

## 2022-08-28 DIAGNOSIS — Z9884 Bariatric surgery status: Secondary | ICD-10-CM | POA: Diagnosis not present

## 2022-08-28 DIAGNOSIS — I10 Essential (primary) hypertension: Secondary | ICD-10-CM | POA: Diagnosis not present

## 2022-08-28 DIAGNOSIS — D509 Iron deficiency anemia, unspecified: Secondary | ICD-10-CM | POA: Diagnosis not present

## 2022-12-20 DIAGNOSIS — E1165 Type 2 diabetes mellitus with hyperglycemia: Secondary | ICD-10-CM | POA: Diagnosis not present

## 2022-12-20 DIAGNOSIS — E782 Mixed hyperlipidemia: Secondary | ICD-10-CM | POA: Diagnosis not present

## 2022-12-20 DIAGNOSIS — Z7984 Long term (current) use of oral hypoglycemic drugs: Secondary | ICD-10-CM | POA: Diagnosis not present

## 2022-12-27 DIAGNOSIS — Z9884 Bariatric surgery status: Secondary | ICD-10-CM | POA: Diagnosis not present

## 2022-12-27 DIAGNOSIS — D508 Other iron deficiency anemias: Secondary | ICD-10-CM | POA: Diagnosis not present

## 2022-12-27 DIAGNOSIS — I10 Essential (primary) hypertension: Secondary | ICD-10-CM | POA: Diagnosis not present

## 2023-02-06 ENCOUNTER — Other Ambulatory Visit (HOSPITAL_BASED_OUTPATIENT_CLINIC_OR_DEPARTMENT_OTHER): Payer: Self-pay

## 2023-02-06 MED ORDER — WEGOVY 0.25 MG/0.5ML ~~LOC~~ SOAJ
0.2500 mg | SUBCUTANEOUS | 0 refills | Status: AC
  Filled 2023-02-06: qty 2, 28d supply, fill #0

## 2023-02-26 DIAGNOSIS — H5213 Myopia, bilateral: Secondary | ICD-10-CM | POA: Diagnosis not present

## 2023-02-26 DIAGNOSIS — E119 Type 2 diabetes mellitus without complications: Secondary | ICD-10-CM | POA: Diagnosis not present

## 2023-02-26 DIAGNOSIS — H4720 Unspecified optic atrophy: Secondary | ICD-10-CM | POA: Diagnosis not present

## 2023-05-09 DIAGNOSIS — K912 Postsurgical malabsorption, not elsewhere classified: Secondary | ICD-10-CM | POA: Diagnosis not present

## 2023-05-09 DIAGNOSIS — Z903 Acquired absence of stomach [part of]: Secondary | ICD-10-CM | POA: Diagnosis not present

## 2023-05-09 DIAGNOSIS — Z9884 Bariatric surgery status: Secondary | ICD-10-CM | POA: Diagnosis not present

## 2023-05-28 DIAGNOSIS — E119 Type 2 diabetes mellitus without complications: Secondary | ICD-10-CM | POA: Diagnosis not present

## 2023-05-28 DIAGNOSIS — Z9884 Bariatric surgery status: Secondary | ICD-10-CM | POA: Diagnosis not present

## 2023-05-28 DIAGNOSIS — K912 Postsurgical malabsorption, not elsewhere classified: Secondary | ICD-10-CM | POA: Diagnosis not present

## 2023-05-28 DIAGNOSIS — D509 Iron deficiency anemia, unspecified: Secondary | ICD-10-CM | POA: Diagnosis not present

## 2023-05-28 DIAGNOSIS — Z1330 Encounter for screening examination for mental health and behavioral disorders, unspecified: Secondary | ICD-10-CM | POA: Diagnosis not present

## 2024-05-31 NOTE — Progress Notes (Signed)
 Gabriela Cantu                                          MRN: [id number]   05/31/2024   The [facility] Quality Team Specialist reviewed this patient medical record for the purposes of chart review for care gap closure. The following were reviewed: chart review for care gap closure-kidney health evaluation for diabetes:eGFR  and uACR.    [facility] Quality Team
